# Patient Record
Sex: Female | Born: 1980
Health system: Southern US, Community
[De-identification: ages and names within clinical notes are randomized; demographics above are authoritative.]

## PROBLEM LIST (undated history)

## (undated) DIAGNOSIS — IMO0002 Reserved for concepts with insufficient information to code with codable children: Secondary | ICD-10-CM

## (undated) DIAGNOSIS — R87629 Unspecified abnormal cytological findings in specimens from vagina: Secondary | ICD-10-CM

## (undated) DIAGNOSIS — G629 Polyneuropathy, unspecified: Secondary | ICD-10-CM

## (undated) DIAGNOSIS — E049 Nontoxic goiter, unspecified: Secondary | ICD-10-CM

## (undated) DIAGNOSIS — E559 Vitamin D deficiency, unspecified: Secondary | ICD-10-CM

## (undated) DIAGNOSIS — R87619 Unspecified abnormal cytological findings in specimens from cervix uteri: Secondary | ICD-10-CM

## (undated) DIAGNOSIS — A599 Trichomoniasis, unspecified: Secondary | ICD-10-CM

## (undated) DIAGNOSIS — Z8619 Personal history of other infectious and parasitic diseases: Secondary | ICD-10-CM

## (undated) DIAGNOSIS — A749 Chlamydial infection, unspecified: Secondary | ICD-10-CM

## (undated) DIAGNOSIS — K5790 Diverticulosis of intestine, part unspecified, without perforation or abscess without bleeding: Secondary | ICD-10-CM

## (undated) DIAGNOSIS — G43909 Migraine, unspecified, not intractable, without status migrainosus: Secondary | ICD-10-CM

## (undated) DIAGNOSIS — Z8669 Personal history of other diseases of the nervous system and sense organs: Secondary | ICD-10-CM

## (undated) DIAGNOSIS — K5792 Diverticulitis of intestine, part unspecified, without perforation or abscess without bleeding: Secondary | ICD-10-CM

## (undated) HISTORY — DX: Polyneuropathy, unspecified: G62.9

## (undated) HISTORY — DX: Chlamydial infection, unspecified: A74.9

## (undated) HISTORY — PX: DENTAL SURGERY: SHX609

## (undated) HISTORY — PX: CYST EXCISION: SHX5701

## (undated) HISTORY — DX: Unspecified abnormal cytological findings in specimens from cervix uteri: R87.619

## (undated) HISTORY — PX: ENDOMETRIAL ABLATION: SHX621

## (undated) HISTORY — DX: Nontoxic goiter, unspecified: E04.9

## (undated) HISTORY — DX: Diverticulitis of intestine, part unspecified, without perforation or abscess without bleeding: K57.92

## (undated) HISTORY — DX: Personal history of other infectious and parasitic diseases: Z86.19

## (undated) HISTORY — DX: Trichomoniasis, unspecified: A59.9

## (undated) HISTORY — PX: CHOLECYSTECTOMY: SHX55

## (undated) HISTORY — DX: Personal history of other diseases of the nervous system and sense organs: Z86.69

## (undated) HISTORY — DX: Diverticulosis of intestine, part unspecified, without perforation or abscess without bleeding: K57.90

## (undated) HISTORY — DX: Vitamin D deficiency, unspecified: E55.9

## (undated) HISTORY — DX: Migraine, unspecified, not intractable, without status migrainosus: G43.909

## (undated) HISTORY — DX: Reserved for concepts with insufficient information to code with codable children: IMO0002

## (undated) HISTORY — DX: Unspecified abnormal cytological findings in specimens from vagina: R87.629

---
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
## 2001-04-26 ENCOUNTER — Encounter: Payer: Self-pay | Admitting: Obstetrics and Gynecology

## 2001-04-26 ENCOUNTER — Ambulatory Visit (HOSPITAL_COMMUNITY): Admission: RE | Admit: 2001-04-26 | Discharge: 2001-04-26 | Payer: Self-pay | Admitting: Obstetrics and Gynecology

## 2001-04-27 ENCOUNTER — Inpatient Hospital Stay (HOSPITAL_COMMUNITY): Admission: AD | Admit: 2001-04-27 | Discharge: 2001-04-30 | Payer: Self-pay | Admitting: Obstetrics and Gynecology

## 2002-08-05 ENCOUNTER — Emergency Department (HOSPITAL_COMMUNITY): Admission: EM | Admit: 2002-08-05 | Discharge: 2002-08-05 | Payer: Self-pay | Admitting: Emergency Medicine

## 2004-08-07 ENCOUNTER — Ambulatory Visit (HOSPITAL_COMMUNITY): Admission: AD | Admit: 2004-08-07 | Discharge: 2004-08-08 | Payer: Self-pay | Admitting: Obstetrics and Gynecology

## 2004-09-28 ENCOUNTER — Ambulatory Visit (HOSPITAL_COMMUNITY): Admission: RE | Admit: 2004-09-28 | Discharge: 2004-09-28 | Payer: Self-pay | Admitting: Internal Medicine

## 2004-10-18 ENCOUNTER — Ambulatory Visit (HOSPITAL_COMMUNITY): Admission: AD | Admit: 2004-10-18 | Discharge: 2004-10-18 | Payer: Self-pay | Admitting: Obstetrics and Gynecology

## 2004-11-13 ENCOUNTER — Inpatient Hospital Stay (HOSPITAL_COMMUNITY): Admission: RE | Admit: 2004-11-13 | Discharge: 2004-11-15 | Payer: Self-pay | Admitting: Internal Medicine

## 2007-03-03 ENCOUNTER — Other Ambulatory Visit: Admission: RE | Admit: 2007-03-03 | Discharge: 2007-03-03 | Payer: Self-pay | Admitting: Obstetrics and Gynecology

## 2008-04-03 ENCOUNTER — Other Ambulatory Visit: Admission: RE | Admit: 2008-04-03 | Discharge: 2008-04-03 | Payer: Self-pay | Admitting: Obstetrics and Gynecology

## 2008-04-04 ENCOUNTER — Ambulatory Visit (HOSPITAL_COMMUNITY): Admission: RE | Admit: 2008-04-04 | Discharge: 2008-04-04 | Payer: Self-pay | Admitting: Obstetrics & Gynecology

## 2008-11-08 ENCOUNTER — Encounter: Payer: Self-pay | Admitting: Obstetrics & Gynecology

## 2008-11-08 ENCOUNTER — Ambulatory Visit (HOSPITAL_COMMUNITY): Admission: RE | Admit: 2008-11-08 | Discharge: 2008-11-08 | Payer: Self-pay | Admitting: Obstetrics & Gynecology

## 2009-07-10 ENCOUNTER — Other Ambulatory Visit: Admission: RE | Admit: 2009-07-10 | Discharge: 2009-07-10 | Payer: Self-pay | Admitting: Obstetrics and Gynecology

## 2010-07-14 ENCOUNTER — Encounter: Payer: Self-pay | Admitting: Obstetrics & Gynecology

## 2010-08-05 ENCOUNTER — Other Ambulatory Visit (HOSPITAL_COMMUNITY)
Admission: RE | Admit: 2010-08-05 | Discharge: 2010-08-05 | Disposition: A | Discharge status: Home / Self Care | Payer: Self-pay | Source: Ambulatory Visit | Attending: Obstetrics & Gynecology | Admitting: Obstetrics & Gynecology

## 2010-08-05 ENCOUNTER — Other Ambulatory Visit: Payer: Self-pay | Admitting: Obstetrics & Gynecology

## 2010-08-05 DIAGNOSIS — Z01419 Encounter for gynecological examination (general) (routine) without abnormal findings: Secondary | ICD-10-CM | POA: Insufficient documentation

## 2010-10-01 LAB — CBC
HCT: 37.3 % (ref 36.0–46.0)
Hemoglobin: 13.2 g/dL (ref 12.0–15.0)
MCHC: 35.5 g/dL (ref 30.0–36.0)
MCV: 87 fL (ref 78.0–100.0)
Platelets: 156 10*3/uL (ref 150–400)
RBC: 4.28 MIL/uL (ref 3.87–5.11)
RDW: 13.4 % (ref 11.5–15.5)
WBC: 8.1 10*3/uL (ref 4.0–10.5)

## 2010-10-01 LAB — COMPREHENSIVE METABOLIC PANEL
ALT: 28 U/L (ref 0–35)
AST: 28 U/L (ref 0–37)
Albumin: 3.7 g/dL (ref 3.5–5.2)
Alkaline Phosphatase: 70 U/L (ref 39–117)
BUN: 4 mg/dL — ABNORMAL LOW (ref 6–23)
CO2: 25 mEq/L (ref 19–32)
Calcium: 9.1 mg/dL (ref 8.4–10.5)
Chloride: 108 mEq/L (ref 96–112)
Creatinine, Ser: 0.65 mg/dL (ref 0.4–1.2)
GFR calc Af Amer: >60 mL/min (ref >60)
GFR calc non Af Amer: >60 mL/min (ref >60)
Glucose, Bld: 90 mg/dL (ref 70–99)
Potassium: 4.4 mEq/L (ref 3.5–5.1)
Sodium: 139 mEq/L (ref 135–145)
Total Bilirubin: 0.8 mg/dL (ref 0.3–1.2)
Total Protein: 6.4 g/dL (ref 6.0–8.3)

## 2010-10-01 LAB — URINALYSIS, MICROSCOPIC ONLY
Glucose, UA: NEGATIVE mg/dL
Hgb urine dipstick: NEGATIVE
Ketones, ur: NEGATIVE mg/dL
Protein, ur: NEGATIVE mg/dL
Urobilinogen, UA: 0.2 mg/dL (ref 0.0–1.0)

## 2010-11-05 NOTE — Op Note (Signed)
NAMEWILDER, AMODEI                 ACCOUNT NO.:  [id number]   MEDICAL RECORD NO.:  [id number]          PATIENT TYPE:  AMB   LOCATION:  DAY                           FACILITY:  [facility]   PHYSICIAN:  Lazaro Arms, M.D.   DATE OF BIRTH:  04-23-1981   DATE OF PROCEDURE:  11/08/2008  DATE OF DISCHARGE:  11/08/2008                               OPERATIVE REPORT   PREOPERATIVE DIAGNOSES:  1. Menometrorrhagia.  2. Dysmenorrhea.   POSTOPERATIVE DIAGNOSES:  1. Menometrorrhagia.  2. Dysmenorrhea.   PROCEDURE:  1. Hysteroscopy.  2. Dilatation and curettage.  3. Endometrial ablation.   SURGEON:  Lazaro Arms, MD   ANESTHESIA:  General endotracheal.   FINDINGS:  The patient had a normal endometrium, no polyps, no fluid, no  fibroids, no abnormalities.   DESCRIPTION OF OPERATION:  The patient was taken to the operating room,  placed in supine position, and underwent general endotracheal  anesthesia, placed in dorsal lithotomy position, prepped and draped in  usual sterile fashion.  The cervix was dilated serially to allow passage  of the hysteroscope.  Hysteroscopy was performed and found to be normal.  Uterine curettage was performed.  ThermaChoice III endometrial ablation  balloon was then used, 23 mL of D5W was required to maintain a pressure  between 109 mmHg throughout the procedure.  It was heated to 88 degrees  Celsius.  Total therapy time was 10 minutes and 5 seconds.  All the  fluid was returned at the end of the procedure.  The patient tolerated  the procedure well.  She experienced minimal blood loss, was taken to  recovery in good stable condition.  All counts were correct x3.      Lazaro Arms, M.D.  Electronically Signed     LHE/MEDQ  D:  11/08/2008  T:  11/09/2008  Job:  [id number]

## 2010-11-08 NOTE — H&P (Signed)
[facility]  Patient:    Nina Terry, Nina Terry Visit Number: [id number] MRN: [id number]          Service Type: OBS Location: 4A A415 01 Attending Physician:  Tilda Burrow Dictated by:   Zerita Boers, N.M. Admit Date:  04/26/2001                           History and Physical  ADMITTING DIAGNOSIS:  Pregnancy at 40 weeks with macrosomia who presented with pregnancy induced hypertension.  HISTORY OF PRESENT ILLNESS:  Nina Terry was admitted late in the evening last night with elevated blood pressure, 1+ pitting edema, bleeding and cramping since she left the office today.  PAST MEDICAL HISTORY:  Negative.  PAST SURGICAL HISTORY:  Oral surgery.  MEDICATION:  Prenatal vitamins.  SOCIAL HISTORY:  She is single.  Her boyfriend is presently supportive.  PRENATAL CARE:  Blood type is O positive.  UDS is positive for THC early in the pregnancy.  Rubella immune.  Hepatitis B surface antigen negative.  HIV nonreactive.  HSV negative.  HPV negative.  Serology nonreactive.  GC and chlamydia are both negative.  MSAFP was elevated and she had a rescan which was normal.  Hemoglobin 10.8 and hematocrit 35.1.  One hour glucose tolerance was 98.  Sickle cell screen negative.  PHYSICAL EXAMINATION:  VITAL SIGNS:  Blood pressure 157/99.  NEUROLOGIC:  DTRs 1+ bilaterally.  EXTREMITIES:  She has 1+ pitting edema.  She has facial edema.  LABORATORY DATA AND X-RAY FINDINGS:  Urine negative for protein.  Estimated fetal weight is 9+ pounds.  PLAN:  Due to narrow pelvic inlet and a nonfavorable cervix and a fetus that is not in the pelvis, it has been discussed with the patient risks and benefits of vaginal versus cesarean birth discussed.  The decision was made to do a primary cesarean section due to fetal macrosomia. Dictated by:   Zerita Boers, N.M. Attending Physician:  Tilda Burrow DD:  04/27/01 TD:  04/27/01 Job: [id number]

## 2010-11-08 NOTE — Op Note (Signed)
[facility]  Patient:    Nina Terry, Nina Terry Visit Number: [id number] MRN: [id number]          Service Type: OUT Location: RAD Attending Physician:  Tilda Burrow Dictated by:   Christin Bach, M.D. Proc. Date: 04/27/01 Admit Date:  04/26/2001 Discharge Date: 04/26/2001   CC:         Mel Almond, M.D.   Operative Report  PREOPERATIVE DIAGNOSES: 1. Pregnancy at 38-1/[redacted] weeks gestation. 2. Fetal macrosomia. 3. 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 4. Early pregnancy-induced hypertension symptoms.  POSTOPERATIVE DIAGNOSES: 1. Pregnancy at 38-1/[redacted] weeks gestation. 2. Fetal macrosomia. 3. 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 4. Early pregnancy-induced hypertension symptoms.  OPERATION:  Primary low transverse cervical cesarean section.  SURGEON:  Christin Bach, M.D.  ASSISTANT:  Zerita Boers, M.D.  ANESTHESIA:  Spinal.  COMPLICATIONS:  Slightly elevated spinal anesthesia to level of C6, without sequelae.  DESCRIPTION OF PROCEDURE:  The patient was taken to the operating room and prepped and draped for low abdominal surgery.  Pfannenstiel type incision was performed without difficulty.  The bladder flap was very high on the anterior abdominal wall, so the peritoneal incision was transversely made just above the bladder.  The patient then had transverse uterine incision performed after bladder flap mobilization on the front of the uterus, and a transverse incision was made without difficulty and extended laterally using index finger traction.  The vacuum extractor was placed on the fetal vertex.  A generous loop of cord prolapsed through the incision.  Vacuum extractor popped off once before successful delivery of the vertex using fundal pressure as the propulsive force with vacuum extractor guidance.  The patient then had the infant delivered to awaiting Dr. Lilyan Punt for management.  The infant cried vigorously from the start and had Apgars 8/10 assigned by  Dr. Gerda Diss.  Placenta was delivered with cord blood sampled obtained and are documented elsewhere.  The placenta delivered was noted to have a marginal insertion of the cord, approximately 4 cm from the edge of the placenta and only a very short distance from the opening in the membranes that represented the uterine incision opening.  We interpreted this as an anterior, low-lying placenta location.  The patient then had irrigation of the uterus and single layer running locking closure of the uterus.  A single interrupted 2-0 chromic was necessary to complete adequate hemostasis.  Then irrigation of the bladder flap, closure of the bladder flap with 2-0 chromic; then irrigation of the abdomen, 2-0 chronic closure of the peritoneal cavity, 0 Vicryl closure of the fascia, 2-0 plain approximation of the subcutaneous fatty tissue with staple closure of the skin.  The patient tolerated the procedure well and went to the recovery room in good condition. Dictated by:   Christin Bach, M.D. Attending Physician:  Tilda Burrow DD:  04/27/01 TD:  04/28/01 Job: [id number]

## 2010-11-08 NOTE — Discharge Summary (Signed)
Nina Terry, Nina Terry                 ACCOUNT NO.:  [id number]   MEDICAL RECORD NO.:  [id number]          PATIENT TYPE:  INP   LOCATION:  A412                          FACILITY:  [facility]   PHYSICIAN:  Tilda Burrow, M.D. DATE OF BIRTH:  01/05/1981   DATE OF ADMISSION:  11/13/2004  DATE OF DISCHARGE:  05/26/2006LH                                 DISCHARGE SUMMARY   ADMISSION DIAGNOSES:  1.  Pregnancy at 38-1/2 weeks' gestation with repeat cesarean section, not      for trial of labor and desires elective permanent sterilization.  2.  Anemia of pregnancy.   DISCHARGE DIAGNOSES:  1.  Pregnancy at 38-1/2 weeks' gestation with repeat cesarean section, not      for trial of labor and desires elective permanent sterilization.  2.  Postoperative anemia.   PROCEDURES:  1.  Repeat low transverse cervical cesarean section.  2.  Bilateral tubal ligation.   DISCHARGE MEDICATIONS:  Tylox one q.4h. p.r.n. pain, dispense #30.   HISTORY OF PRESENT ILLNESS:  This 30 year old female, G2, P1, AB0 was  admitted at term gestation for repeat cesarean section and tubal ligation.  She is desirous of sterilization despite her young age and had been  counseled multiple times during the pregnancy regarding risks and potential  future issues based on her young age.  The patient is completely confident  of her desire for no more children regardless of life changes.  Physical  exam shows a moderately obese, Caucasian female with 41 cm fundal height  with prenatal labs in the admitting history.   HOSPITAL COURSE:  The patient was admitted and underwent repeat low  transverse cervical cesarean section and bilateral tubal ligation on Nov 13, 2004.  She had an admitting hemoglobin of 11.9, hematocrit 34.5.  Blood loss  at the time of surgery was estimated at 500 cc.  Postoperatively, the  patient had anemia.  She had a hemoglobin of 9.5, hematocrit 27.5.  She was  sent home on Chromagen Forte one twice daily x30  days along with routine  postoperative medications for followup in 5 days for staple removal.  Routine postop instructions given.       JVF/MEDQ  D:  01/10/2005  T:  01/10/2005  Job:  [id number]

## 2010-11-08 NOTE — H&P (Signed)
Nina Terry, Nina Terry                 ACCOUNT NO.:  [id number]   MEDICAL RECORD NO.:  [id number]          PATIENT TYPE:  AMB   LOCATION:  DAY                           FACILITY:  [facility]   PHYSICIAN:  Tilda Burrow, M.D. DATE OF BIRTH:  1981-03-04   DATE OF ADMISSION:  11/13/2004  DATE OF DISCHARGE:  LH                                HISTORY & PHYSICAL   ADMISSION DIAGNOSES:  1.  Pregnancy at 38-1/[redacted] weeks gestation.  2.  Will repeat cesarean section and offer a trial of labor.  3.  Desire for elective permanent sterilization.   HISTORY OF PRESENT ILLNESS:  This 30 year old female, gravida 2, para 1, AB  0, LMP unknown with ultrasound assigned EDC of November 23, 2004, based on first  trimester scan and Nov 14, 2004, based on a second trimester scan at 20  weeks.  She is admitted after pregnancy course followed through our office  through 13 prenatal visits with generous fundal height growth, size finally  greater than dates, with plans to proceed with repeat cesarean section at  this time.  She also desires permanent sterilization, acknowledging the  permanency of the procedure even though she is only 23.  Failure rate is  considered 1 to 2 per 100 for cesarean section tubal sterilizations.   PAST MEDICAL HISTORY:  Benign.   PAST SURGICAL HISTORY:  Cesarean section in 2002.   ALLERGIES:  None known.   PHYSICAL EXAMINATION:  GENERAL:  Healthy-appearing Caucasian female, alert  and oriented x 3.  HEENT:  Pupils equal, round, and reactive.  Extraocular movements intact.  NECK:  Supple.  ABDOMEN:  Moderate obesity.  Fundal height 41 cm.  PELVIC:  Cervix closed at prior exam.   PRENATAL LABORATORY DATA:  Blood type O positive.  Urine drug screen  positive for THC initially and retested as negative in third trimester.  Hemoglobin 12, hematocrit 40.  Hepatitis, HIV, RPR, GC, and chlamydia all  negative.  She is HSV-2 negative.  Glucose tolerance test is 107 mg percent.   PLAN:  Repeat  cesarean section and tubal ligation on Nov 13, 2004.      JVF/MEDQ  D:  11/12/2004  T:  11/12/2004  Job:  [id number]   cc:   Francoise Schaumann. Halford Chessman  Fax: [phone]

## 2010-11-08 NOTE — Discharge Summary (Signed)
Nina Terry, Nina Terry                 ACCOUNT NO.:  [id number]   MEDICAL RECORD NO.:  [id number]          PATIENT TYPE:  OIB   LOCATION:  A414                          FACILITY:  [facility]   PHYSICIAN:  Langley Gauss, MD     DATE OF BIRTH:  December 28, 1980   DATE OF ADMISSION:  10/18/2004  DATE OF DISCHARGE:  04/28/2006LH                                 DISCHARGE SUMMARY   HISTORY OF PRESENT ILLNESS:  A 30 year old gravida 2, para 1, [redacted] weeks  gestation with one prior low transverse cesarean section complains of  greenish discharge with odor x2 days duration.  Actually, felt moist in her  undergarments today, denies any leakage of fluids, specifically, no clear  fluid, no fluid running down her leg.  The patient's prenatal course has  been complicated only by findings of positive marijuana on drug screen.  The  patient is planning on proceeding with permanent sterilization.  She  describes good fetal movement, denies any vaginal bleeding.  She states she  has been having contractions for several days duration.   PHYSICAL EXAMINATION:  GENERAL:  Obese female in no acute distress.  External fetal monitor:  Fetal heart rate 150, reassuring fetal heart rate.  Uterine activity noted every two to four minutes.  The patient states this  is no change over the past several days.  PELVIC:  Normal external genitalia.  No lesions or ulcerations identified.  Sterile speculum examination is performed which reveals a greenish odorous  discharge coming from the cervix.   GC and Chlamydia cultures are performed, currently pending.  Wet prep is  deferred secondary to the logistic difficulties of the staff here in  providing glass slide, sterile normal slide, and a cover slip all on the  same occasion.  The patient thus has been treated empirically with Flagyl  500 mg p.o. b.i.d. x7 days.  Urinalysis was performed which did reveal many  epithelial cells and positive esterase.  Urine culture is currently pending.  Signs and symptoms of labor as well as spontaneous rupture of membranes  reviewed with the patient.  She is to be discharged to home with the  prescription for Flagyl 500 mg p.o. b.i.d. x7 days.  Cervix is noted to be  closed.   LABORATORY DATA:  Urinalysis: Urine specific gravity is greater than 1.03,  moderate hemoglobin, moderate leukocyte esterase, with culture currently  pending.      DC/MEDQ  D:  10/18/2004  T:  10/19/2004  Job:  [id number]

## 2010-11-08 NOTE — Consult Note (Signed)
NAMEGLAYDS, [employer]SC[employer]                 ACCOUNT NO.:  [id number]   MEDICAL RECORD NO.:  [id number]          PATIENT TYPE:  OIB   LOCATION:  LDR3                          FACILITY:  [facility]   PHYSICIAN:  Tilda Burrow, M.D. DATE OF BIRTH:  1981/04/18   DATE OF CONSULTATION:  DATE OF DISCHARGE:  09/28/2004                                   CONSULTATION   CONSULTING PHYSICIAN:  Tilda Burrow, M.D.   CHIEF COMPLAINT:  1.  Pregnancy 32 weeks' gestation prior cesarean section for repeat C-      section and tubal ligation.  2.  Suspected urinary tract infection.   HISTORY OF PRESENT ILLNESS:  A 30 year old female, gravida 2, para 1,  uncertain LMP with ultrasound EDC of November 23, 2004, on six week ultrasound.  Followed in our office with one prior urinary tract infection, presents to  labor and delivery complaining of urinary tract symptoms.  Fetal monitoring  shows no uterine irritability.  Urinalysis shows large amounts of leukocyte  esterase, 7-10 white cells, few bacteria with negative nitrites.  Based on a  very classic history of urinary frequency, etcetera, we will treat for  urinary tract infection.   PLAN:  1.  Rx Macrobid 100 mg b.i.d. x 7 days.  2.  Followup appointment as scheduled for our office.  3.  After completion of this regimen, we will suppress with Macrodantin 100      mg q.h.s.  Rx to be written at next office visit.      JVF/MEDQ  D:  09/28/2004  T:  09/29/2004  Job:  [id number]

## 2010-11-08 NOTE — Op Note (Signed)
Nina Terry, Nina Terry                 ACCOUNT NO.:  [id number]   MEDICAL RECORD NO.:  [id number]          PATIENT TYPE:  INP   LOCATION:  A412                          FACILITY:  [facility]   PHYSICIAN:  Tilda Burrow, M.D. DATE OF BIRTH:  June 03, 1981   DATE OF PROCEDURE:  11/13/2004  DATE OF DISCHARGE:  11/15/2004                                 OPERATIVE REPORT   PREOPERATIVE DIAGNOSES:  1.  Pregnancy, 38-1/[redacted] weeks gestation.  2.  Repeat cesarean section, not for trial of labor.  3.  Elective permanent sterilization.   POSTOPERATIVE DIAGNOSES:  1.  Pregnancy, 38-1/[redacted] weeks gestation.  2.  Repeat cesarean section, not for trial of labor.  3.  Elective permanent sterilization.   PROCEDURE:  Repeat low transverse cervical cesarean section, bilateral  partial salpingectomy.   SURGEON:  Tilda Burrow, M.D.   ASSISTANT:  None.   ANESTHESIA:  Spinal.   COMPLICATIONS:  None.   FINDINGS:  A healthy infant.  Apgars 9 and 9.   INDICATIONS:  A 31 year old female, gravida 2, para 1.  Not desiring trial  of labor and requesting permanent sterilization.   DESCRIPTION OF PROCEDURE:  Patient was taken to the operating room.  Prepped  and draped in the usual fashion for lower abdominal surgery.  A Pfannenstiel-  type incision was repeated with easy entry of the abdominal cavity.  The  bladder flap was developed on the lower uterine segment.  A transverse  uterine incision made in the lower uterine segment, extended laterally using  index finger traction and then the baby delivered by fundal pressure,  combined with vacuum extraction guidance to the vertex.  The cord was  clamped.  The infant was placed in the care of the waiting pediatrician.  Then the cord blood gases were obtained, and the placenta was delivered via  Glasgow presentation.  The uterus was irrigated with antibiotic solution.  A  single layer of running, locking closure of the uterus performed followed by  bladder flap  reapproximation.   Tubal ligation:  The tubal ligation was then performed by identifying the  fallopian tube on the right side first and then the left side, incarcerating  the mid segment knuckle of tube with a double ligature of 2-0 chromic and  excising the incarcerated knuckle of tissue.  The patient tolerated the  procedure well and was then hemostatic.  The abdomen was irrigated briefly,  as had been the uterus.  Then the anterior peritoneum was closed with 2-0  chromic.  The fascia was closed with continuous running 0 Vicryl.  The subcu  tissues were reapproximated using 2-0 plain and staple closure of the skin  was used to complete the procedure.  The patient went to the recovery room  in good condition.  Sponge and needle counts were correct.      JVF/MEDQ  D:  11/25/2004  T:  11/25/2004  Job:  [id number]   cc:   [facility]

## 2011-06-13 ENCOUNTER — Encounter: Payer: Self-pay | Admitting: Obstetrics and Gynecology

## 2012-10-18 ENCOUNTER — Ambulatory Visit (INDEPENDENT_AMBULATORY_CARE_PROVIDER_SITE_OTHER): Payer: BC Managed Care – PPO | Admitting: Adult Health

## 2012-10-18 ENCOUNTER — Encounter: Payer: Self-pay | Admitting: Adult Health

## 2012-10-18 VITALS — BP 112/72 | Ht 63.0 in | Wt 237.0 lb

## 2012-10-18 DIAGNOSIS — A599 Trichomoniasis, unspecified: Secondary | ICD-10-CM

## 2012-10-18 DIAGNOSIS — N898 Other specified noninflammatory disorders of vagina: Secondary | ICD-10-CM

## 2012-10-18 DIAGNOSIS — Z8742 Personal history of other diseases of the female genital tract: Secondary | ICD-10-CM

## 2012-10-18 DIAGNOSIS — Z87898 Personal history of other specified conditions: Secondary | ICD-10-CM | POA: Insufficient documentation

## 2012-10-18 HISTORY — DX: Trichomoniasis, unspecified: A59.9

## 2012-10-18 LAB — POCT URINALYSIS DIPSTICK
Bilirubin, UA: NEGATIVE
Glucose, UA: NEGATIVE
Spec Grav, UA: 1.02
pH, UA: 7

## 2012-10-18 LAB — POCT WET PREP (WET MOUNT): WBC, Wet Prep HPF POC: POSITIVE

## 2012-10-18 MED ORDER — METRONIDAZOLE 500 MG PO TABS: ORAL_TABLET | ORAL | Status: DC

## 2012-10-18 NOTE — Progress Notes (Signed)
Subjective:     Patient ID: Nina Terry, female   DOB: [birth year], 32 y.o.   MRN: [id number]  HPI Nina Terry is 32 year old white female in today complaining of vaginal discharge and odor,with itching.She has had unprotected sex, and it hurt last time.Her partner was her kids daddy but they had split up. She needs a pap and physical in near future. Review of Systems Patient denies any headaches, blurred vision, shortness of breath, chest pain, abdominal pain, problems with bowel movements. She does say she feels uncomfortable with urination, sex hurt last time and she has vaginal discharge and itch and odor.   Reviewed past medical,surgical, social and family history. Reviewed medications and allergies.  Objective:   Physical Exam Blood pressure 112/72, height 5\' 3"  (1.6 m), weight 237 lb (107.502 kg).   Urine had trace blood and large WBC. Skin warm and dry.Pelvic: external genitalia is normal in appearance, vagina: grayish discharge with odor and tender side walls, cervix:smooth and bulbous,negative CMT, uterus: normal size, shape and contour, non tender, no masses felt, adnexa: no masses or tenderness noted. Wet prep: + for trichomonas and +WBCs. GC/CHL obtained.  Assessment:      Trichomonas  Vaginal discharge History of abnormal pap    Plan:      Take flagyl, no sex, no alcohol Gave her Rx for flagyl 500 mg # 4, take 4 po now for partner, Nina Terry Return in 2 weeks for proof of treatment, will schedule pap after that. Call in am for GC/CHL results

## 2012-10-18 NOTE — Patient Instructions (Addendum)
Trichomoniasis Trichomoniasis is an infection, caused by the Trichomonas organism, that affects both women and men. In women, the outer female genitalia and the vagina are affected. In men, the penis is mainly affected, but the prostate and other reproductive organs can also be involved. Trichomoniasis is a sexually transmitted disease (STD) and is most often passed to another person through sexual contact. The majority of people who get trichomoniasis do so from a sexual encounter and are also at risk for other STDs. CAUSES   Sexual intercourse with an infected partner.  It can be present in swimming pools or hot tubs. SYMPTOMS   Abnormal gray-green frothy vaginal discharge in women.  Vaginal itching and irritation in women.  Itching and irritation of the area outside the vagina in women.  Penile discharge with or without pain in males.  Inflammation of the urethra (urethritis), causing painful urination.  Bleeding after sexual intercourse. RELATED COMPLICATIONS  Pelvic inflammatory disease.  Infection of the uterus (endometritis).  Infertility.  Tubal (ectopic) pregnancy.  It can be associated with other STDs, including gonorrhea and chlamydia, hepatitis B, and HIV. COMPLICATIONS DURING PREGNANCY  Early (premature) delivery.  Premature rupture of the membranes (PROM).  Low birth weight. DIAGNOSIS   Visualization of Trichomonas under the microscope from the vagina discharge.  Ph of the vagina greater than 4.5, tested with a test tape.  Trich Rapid Test.  Culture of the organism, but this is not usually needed.  It may be found on a Pap test.  Having a "strawberry cervix,"which means the cervix looks very red like a strawberry. TREATMENT   You may be given medication to fight the infection. Inform your caregiver if you could be or are pregnant. Some medications used to treat the infection should not be taken during pregnancy.  Over-the-counter medications or  creams to decrease itching or irritation may be recommended.  Your sexual partner will need to be treated if infected. HOME CARE INSTRUCTIONS   Take all medication prescribed by your caregiver.  Take over-the-counter medication for itching or irritation as directed by your caregiver.  Do not have sexual intercourse while you have the infection.  Do not douche or wear tampons.  Discuss your infection with your partner, as your partner may have acquired the infection from you. Or, your partner may have been the person who transmitted the infection to you.  Have your sex partner examined and treated if necessary.  Practice safe, informed, and protected sex.  See your caregiver for other STD testing. SEEK MEDICAL CARE IF:   You still have symptoms after you finish the medication.  You have an oral temperature above 102 F (38.9 C).  You develop belly (abdominal) pain.  You have pain when you urinate.  You have bleeding after sexual intercourse.  You develop a rash.  The medication makes you sick or makes you throw up (vomit). Document Released: 12/03/2000 Document Revised: 09/01/2011 Document Reviewed: 12/29/2008 [facility] Patient Information 2013 [place], [place]. TrichomonasTest This is a test used to diagnose an infection with Trichomonas vaginalis. This is a sexually transmitted, microscopic parasite that causes vaginal infections in women and urethritis in some men. This test is often done if there is vaginal discharge or pain on urination. If you have an infection with another sexually transmitted disease, your caregiver might test for trichomonas as well. Secretions or a sample are collected on a swab and are examined under a microscope or cultured to detect the presence of Trichomonas vaginalis. Trichomonas is one of  the most common sexually transmitted diseases. An infected person is at greater risk of getting other sexually transmitted diseases, so your caregiver may want  to test for these other infections also.  Trichomonas infection can affect pregnancy, contributing to premature birth and low birth weight. You should inform your physician if you may be pregnant. The doctor may medically manage a woman who is infected and in her first three months of pregnancy differently.  SPECIMEN COLLECTION In women, a swab of secretions is collected from the vagina. In men, a swab is inserted into the urethra. MEANING OF TEST  A positive test indicates an active infection that requires treatment with antibiotics. It is usually treated with an antibiotic called metronidazole. All current sexual partners must be treated at the same time or the patient is likely to become re-infected. The Center for Disease Control (CDC) recommends the following guidelines to avoid infection or reinfection:   Abstain from sexual intercourse  Use a latex condom properly, every time you have sexual intercourse, with every partner.  Limit your sexual partners. The more sex partners you have, the greater your risk of encountering someone who has this or other STI's.  If you are infected, your sexual partner(s) should be treated. This will prevent you from getting reinfected. OBTAINING THE TEST RESULTS It is your responsibility to obtain your test results. Ask the lab or department performing the test when and how you will get your results. Document Released: 07/12/2004 Document Revised: 09/01/2011 Document Reviewed: 03/19/2005 [facility] Patient Information 2013 [place], [place]. Take flagyl, no sex,no alcohol, Call in am for results,Sign up for my chart.Return in 2 weeks proof of treatment. Then schedule pap and physical

## 2012-10-18 NOTE — Assessment & Plan Note (Signed)
Will treat pt and partner with flagyl

## 2012-10-19 ENCOUNTER — Telehealth: Payer: Self-pay | Admitting: Adult Health

## 2012-10-19 LAB — GC/CHLAMYDIA PROBE AMP: CT Probe RNA: NEGATIVE

## 2012-10-19 NOTE — Telephone Encounter (Signed)
Pt aware GC/CHL negative 

## 2012-11-01 ENCOUNTER — Encounter: Payer: Self-pay | Admitting: *Deleted

## 2012-11-02 ENCOUNTER — Ambulatory Visit: Payer: Self-pay | Admitting: Adult Health

## 2013-08-11 ENCOUNTER — Other Ambulatory Visit (HOSPITAL_COMMUNITY)
Admission: RE | Admit: 2013-08-11 | Discharge: 2013-08-11 | Disposition: A | Discharge status: Home / Self Care | Payer: BC Managed Care – PPO | Source: Ambulatory Visit | Attending: Adult Health | Admitting: Adult Health

## 2013-08-11 ENCOUNTER — Encounter (INDEPENDENT_AMBULATORY_CARE_PROVIDER_SITE_OTHER): Payer: Self-pay

## 2013-08-11 ENCOUNTER — Ambulatory Visit (INDEPENDENT_AMBULATORY_CARE_PROVIDER_SITE_OTHER): Payer: BC Managed Care – PPO | Admitting: Adult Health

## 2013-08-11 ENCOUNTER — Encounter: Payer: Self-pay | Admitting: Adult Health

## 2013-08-11 VITALS — BP 120/82 | HR 78 | Ht 63.0 in | Wt 223.0 lb

## 2013-08-11 DIAGNOSIS — Z8742 Personal history of other diseases of the female genital tract: Secondary | ICD-10-CM

## 2013-08-11 DIAGNOSIS — R87810 Cervical high risk human papillomavirus (HPV) DNA test positive: Secondary | ICD-10-CM | POA: Insufficient documentation

## 2013-08-11 DIAGNOSIS — Z124 Encounter for screening for malignant neoplasm of cervix: Secondary | ICD-10-CM | POA: Insufficient documentation

## 2013-08-11 DIAGNOSIS — Z01419 Encounter for gynecological examination (general) (routine) without abnormal findings: Secondary | ICD-10-CM

## 2013-08-11 DIAGNOSIS — Z1151 Encounter for screening for human papillomavirus (HPV): Secondary | ICD-10-CM | POA: Insufficient documentation

## 2013-08-11 LAB — COMPREHENSIVE METABOLIC PANEL
ALBUMIN: 4.1 g/dL (ref 3.5–5.2)
ALK PHOS: 59 U/L (ref 39–117)
ALT: 28 U/L (ref 0–35)
AST: 28 U/L (ref 0–37)
BUN: 6 mg/dL (ref 6–23)
CALCIUM: 8.7 mg/dL (ref 8.4–10.5)
CHLORIDE: 105 meq/L (ref 96–112)
CO2: 27 mEq/L (ref 19–32)
CREATININE: 0.63 mg/dL (ref 0.50–1.10)
Glucose, Bld: 78 mg/dL (ref 70–99)
POTASSIUM: 4.2 meq/L (ref 3.5–5.3)
Sodium: 137 mEq/L (ref 135–145)
Total Bilirubin: 0.4 mg/dL (ref 0.2–1.2)
Total Protein: 6.4 g/dL (ref 6.0–8.3)

## 2013-08-11 LAB — LIPID PANEL
Cholesterol: 140 mg/dL (ref 0–200)
HDL: 35 mg/dL — AB (ref >39)
LDL CALC: 84 mg/dL (ref 0–99)
TRIGLYCERIDES: 107 mg/dL (ref <150)
Total CHOL/HDL Ratio: 4 Ratio
VLDL: 21 mg/dL (ref 0–40)

## 2013-08-11 LAB — CBC
HCT: 41.5 % (ref 36.0–46.0)
Hemoglobin: 13.6 g/dL (ref 12.0–15.0)
MCH: 30.2 pg (ref 26.0–34.0)
MCHC: 32.8 g/dL (ref 30.0–36.0)
MCV: 92 fL (ref 78.0–100.0)
PLATELETS: 176 10*3/uL (ref 150–400)
RBC: 4.51 MIL/uL (ref 3.87–5.11)
RDW: 13.4 % (ref 11.5–15.5)
WBC: 8.9 10*3/uL (ref 4.0–10.5)

## 2013-08-11 LAB — TSH: TSH: 1.288 u[IU]/mL (ref 0.350–4.500)

## 2013-08-11 NOTE — Progress Notes (Addendum)
Patient ID: Nina Terry, female   DOB: 01/01/1981, 33 y.o.   MRN: [id number] History of Present Illness: Nina Terry is a 33 year old white female in for a pap and physical,no complaints today.Got flu shot at work.   Current Medications, Allergies, Past Medical History, Past Surgical History, Family History and Social History were reviewed in [facility] electronic medical record.   Past Medical History  Diagnosis Date  . Abnormal Pap smear   . Chlamydia   . Enlarged thyroid   . Trichomonas 10/18/2012  . Enlarged thyroid   . Hx of chlamydia infection   . Vaginal Pap smear, abnormal    Past Surgical History  Procedure Laterality Date  . Cesarean section    . Endometrial ablation    . Cesarean section with bilateral tubal ligation  2006  . Cholecystectomy    No current outpatient prescriptions on file.  Review of Systems: Patient denies any headaches, blurred vision, shortness of breath, chest pain, abdominal pain, problems with bowel movements, urination, or intercourse. No joint pain or mood swings,    Physical Exam:BP 120/82  Pulse 78  Ht 5\' 3"  (1.6 m)  Wt 223 lb (101.152 kg)  BMI 39.51 kg/m2 General:  Well developed, well nourished, no acute distress Skin:  Warm and dry Neck:  Midline trachea, thyroid enlarged globally Lungs; Clear to auscultation bilaterally Breast:  No dominant palpable mass, retraction, or nipple discharge Cardiovascular: Regular rate and rhythm Abdomen:  Soft, non tender, no hepatosplenomegaly Pelvic:  External genitalia is normal in appearance.  The vagina is normal in appearance.  The cervix is bulbous and smooth, pap performed with HPV.  Uterus is felt to be normal size, shape, and contour.  No  adnexal masses or tenderness noted. Extremities:  No swelling or varicosities noted Psych:  No mood changes, alert and cooperative, seems happy   Impression: Yearly gyn exam History of abnormal pap   Plan: Physical in 1 year Mammogram at 41  Check  CBC,CMP,TSH and lipids

## 2013-08-11 NOTE — Patient Instructions (Addendum)
Physical in 1 year Mammogram at 41 Follow up labs next week

## 2013-08-12 ENCOUNTER — Telehealth: Payer: Self-pay | Admitting: Adult Health

## 2013-08-12 NOTE — Telephone Encounter (Signed)
Left message that labs normal, just increase activity

## 2013-08-19 ENCOUNTER — Telehealth: Payer: Self-pay | Admitting: Adult Health

## 2013-08-19 NOTE — Telephone Encounter (Signed)
Left message to call about pap 

## 2014-04-24 ENCOUNTER — Encounter: Payer: Self-pay | Admitting: Adult Health

## 2015-03-28 ENCOUNTER — Ambulatory Visit (INDEPENDENT_AMBULATORY_CARE_PROVIDER_SITE_OTHER): Payer: BLUE CROSS/BLUE SHIELD | Admitting: Women's Health

## 2015-03-28 ENCOUNTER — Other Ambulatory Visit (HOSPITAL_COMMUNITY)
Admission: RE | Admit: 2015-03-28 | Discharge: 2015-03-28 | Disposition: A | Discharge status: Home / Self Care | Payer: BLUE CROSS/BLUE SHIELD | Source: Ambulatory Visit | Attending: Obstetrics & Gynecology | Admitting: Obstetrics & Gynecology

## 2015-03-28 ENCOUNTER — Other Ambulatory Visit: Payer: Self-pay | Admitting: Women's Health

## 2015-03-28 ENCOUNTER — Encounter: Payer: Self-pay | Admitting: Women's Health

## 2015-03-28 VITALS — BP 106/64 | HR 72 | Ht 63.75 in | Wt 206.0 lb

## 2015-03-28 DIAGNOSIS — R232 Flushing: Secondary | ICD-10-CM | POA: Insufficient documentation

## 2015-03-28 DIAGNOSIS — Z1151 Encounter for screening for human papillomavirus (HPV): Secondary | ICD-10-CM | POA: Diagnosis present

## 2015-03-28 DIAGNOSIS — Z01411 Encounter for gynecological examination (general) (routine) with abnormal findings: Secondary | ICD-10-CM | POA: Diagnosis present

## 2015-03-28 DIAGNOSIS — Z01419 Encounter for gynecological examination (general) (routine) without abnormal findings: Secondary | ICD-10-CM | POA: Diagnosis not present

## 2015-03-28 DIAGNOSIS — N63 Unspecified lump in unspecified breast: Secondary | ICD-10-CM

## 2015-03-28 DIAGNOSIS — N6320 Unspecified lump in the left breast, unspecified quadrant: Secondary | ICD-10-CM | POA: Insufficient documentation

## 2015-03-28 DIAGNOSIS — Z113 Encounter for screening for infections with a predominantly sexual mode of transmission: Secondary | ICD-10-CM | POA: Insufficient documentation

## 2015-03-28 DIAGNOSIS — F17200 Nicotine dependence, unspecified, uncomplicated: Secondary | ICD-10-CM | POA: Insufficient documentation

## 2015-03-28 DIAGNOSIS — N898 Other specified noninflammatory disorders of vagina: Secondary | ICD-10-CM | POA: Insufficient documentation

## 2015-03-28 NOTE — Progress Notes (Signed)
Patient ID: Nina Terry, female   DOB: 1981/03/11, 34 y.o.   MRN: [id number] Subjective:   Nina Terry is a 34 y.o. G2P2 Caucasian female here for a routine well-woman exam.  No LMP recorded. Patient has had an ablation.    Current complaints: hot flashes, vaginal dryness- feels like she is going through menopause. Was adopted, so unsure of how old mom was when she went through menopause. Has 2 older sisters but they haven't gone through menopause yet.  Lt breast mass, states she was supposed to have mammo last year for mass but 'chickened out', reports no changes, not able to feel it today PCP: Bunnie Pion- [facility]       Had labs for PCP this am including TSH, does desire gc/ct testing  Social History: Sexual: heterosexual Marital Status: engaged Living situation: with fiance and children Occupation: call center Tobacco/alcohol: tobacco: 1/2ppd, etoh: weekends Illicit drugs: no history of illicit drug use  The following portions of the patient's history were reviewed and updated as appropriate: allergies, current medications, past family history, past medical history, past social history, past surgical history and problem list.  Past Medical History Past Medical History  Diagnosis Date  . Abnormal Pap smear   . Chlamydia   . Enlarged thyroid   . Trichomonas 10/18/2012  . Enlarged thyroid   . Hx of chlamydia infection   . Vaginal Pap smear, abnormal     Past Surgical History Past Surgical History  Procedure Laterality Date  . Cesarean section    . Endometrial ablation    . Cesarean section with bilateral tubal ligation  2006  . Cholecystectomy      Gynecologic History G2P2  No LMP recorded. Patient has had an ablation. Contraception: s/p endometrial ablation Last Pap: 2015. Results were: neg w/ +HRHPV Last mammogram: never. Results were: n/a Last TCS: never  Obstetric History OB History  Gravida Para Term Preterm AB SAB TAB Ectopic Multiple Living  #  Outcome Date GA Lbr Len/2nd Weight Sex Delivery Anes PTL Lv  2 Para 2006    F CS-LTranv   Y  1 Para 2002    M CS-LTranv   Y      Current Medications No current outpatient prescriptions on file prior to visit.   No current facility-administered medications on file prior to visit.    Review of Systems Patient denies any headaches, blurred vision, shortness of breath, chest pain, abdominal pain, problems with bowel movements, urination, or intercourse.  Objective:  BP 106/64 mmHg  Pulse 72  Ht 5' 3.75" (1.619 m)  Wt 206 lb (93.441 kg)  BMI 35.65 kg/m2 Physical Exam  General:  Well developed, well nourished, no acute distress. She is alert and oriented x3. Skin:  Warm and dry Neck:  Midline trachea, no thyromegaly or nodules Cardiovascular: Regular rate and rhythm, no murmur heard Lungs:  Effort normal, all lung fields clear to auscultation bilaterally Breasts:  No dominant palpable mass, retraction, or nipple discharge. I am unable to feel the mass in Lt breast. There are ridges under both breasts bilaterally and knotty bumps both medial breasts.  Abdomen:  Soft, non tender, no hepatosplenomegaly or masses Pelvic:  External genitalia is normal in appearance.  The vagina is normal in appearance. The cervix is bulbous, no CMT.  Thin prep pap is done w/ HR HPV cotesting. Uterus is felt to be normal size, shape, and contour.  No adnexal masses or tenderness noted. Extremities:  No swelling or varicosities noted Psych:  She has a normal mood and affect  Assessment:   Healthy well-woman exam Lt breast mass- felt by pt Smoker H/O +HRHPV on pap Hot flashes Vaginal dryness  Plan:  F/U w/ PCP to see if TSH abnormal/poss cause of hot flashes Discussed relief measures for hot flashes and vaginal dryness F/U 24yr for physical, or sooner if needed Advised smoking cessation, can call 1-800-quit-now, gave printed cessation tips Mammogram scheduled 10/18 @ 0815 Colonoscopy  or sooner  if problems  Marge Duncans CNM, [facility] 03/28/2015 3:47 PM

## 2015-03-28 NOTE — Patient Instructions (Addendum)
Mammogram 10/18 @ 8:15am, no lotion, deoderant or powder  Cotton pj's, dress in layers, fans  1-800-quit-now  Smoking Cessation, Tips for Success If you are ready to quit smoking, congratulations! You have chosen to help yourself be healthier. Cigarettes bring nicotine, tar, carbon monoxide, and other irritants into your body. Your lungs, heart, and blood vessels will be able to work better without these poisons. There are many different ways to quit smoking. Nicotine gum, nicotine patches, a nicotine inhaler, or nicotine nasal spray can help with physical craving. Hypnosis, support groups, and medicines help break the habit of smoking. WHAT THINGS CAN I DO TO MAKE QUITTING EASIER?  Here are some tips to help you quit for good:  Pick a date when you will quit smoking completely. Tell all of your friends and family about your plan to quit on that date.  Do not try to slowly cut down on the number of cigarettes you are smoking. Pick a quit date and quit smoking completely starting on that day.  Throw away all cigarettes.   Clean and remove all ashtrays from your home, work, and car.  On a card, write down your reasons for quitting. Carry the card with you and read it when you get the urge to smoke.  Cleanse your body of nicotine. Drink enough water and fluids to keep your urine clear or pale yellow. Do this after quitting to flush the nicotine from your body.  Learn to predict your moods. Do not let a bad situation be your excuse to have a cigarette. Some situations in your life might tempt you into wanting a cigarette.  Never have "just one" cigarette. It leads to wanting another and another. Remind yourself of your decision to quit.  Change habits associated with smoking. If you smoked while driving or when feeling stressed, try other activities to replace smoking. Stand up when drinking your coffee. Brush your teeth after eating. Sit in a different chair when you read the paper. Avoid  alcohol while trying to quit, and try to drink fewer caffeinated beverages. Alcohol and caffeine may urge you to smoke.  Avoid foods and drinks that can trigger a desire to smoke, such as sugary or spicy foods and alcohol.  Ask people who smoke not to smoke around you.  Have something planned to do right after eating or having a cup of coffee. For example, plan to take a walk or exercise.  Try a relaxation exercise to calm you down and decrease your stress. Remember, you may be tense and nervous for the first 2 weeks after you quit, but this will pass.  Find new activities to keep your hands busy. Play with a pen, coin, or rubber band. Doodle or draw things on paper.  Brush your teeth right after eating. This will help cut down on the craving for the taste of tobacco after meals. You can also try mouthwash.   Use oral substitutes in place of cigarettes. Try using lemon drops, carrots, cinnamon sticks, or chewing gum. Keep them handy so they are available when you have the urge to smoke.  When you have the urge to smoke, try deep breathing.  Designate your home as a nonsmoking area.  If you are a heavy smoker, ask your health care provider about a prescription for nicotine chewing gum. It can ease your withdrawal from nicotine.  Reward yourself. Set aside the cigarette money you save and buy yourself something nice.  Look for support from others. Join a  support group or smoking cessation program. Ask someone at home or at work to help you with your plan to quit smoking.  Always ask yourself, "Do I need this cigarette or is this just a reflex?" Tell yourself, "Today, I choose not to smoke," or "I do not want to smoke." You are reminding yourself of your decision to quit.  Do not replace cigarette smoking with electronic cigarettes (commonly called e-cigarettes). The safety of e-cigarettes is unknown, and some may contain harmful chemicals.  If you relapse, do not give up! Plan ahead and  think about what you will do the next time you get the urge to smoke. HOW WILL I FEEL WHEN I QUIT SMOKING? You may have symptoms of withdrawal because your body is used to nicotine (the addictive substance in cigarettes). You may crave cigarettes, be irritable, feel very hungry, cough often, get headaches, or have difficulty concentrating. The withdrawal symptoms are only temporary. They are strongest when you first quit but will go away within 10-14 days. When withdrawal symptoms occur, stay in control. Think about your reasons for quitting. Remind yourself that these are signs that your body is healing and getting used to being without cigarettes. Remember that withdrawal symptoms are easier to treat than the major diseases that smoking can cause.  Even after the withdrawal is over, expect periodic urges to smoke. However, these cravings are generally short lived and will go away whether you smoke or not. Do not smoke! WHAT RESOURCES ARE AVAILABLE TO HELP ME QUIT SMOKING? Your health care provider can direct you to community resources or hospitals for support, which may include:  Group support.  Education.  Hypnosis.  Therapy.   This information is not intended to replace advice given to you by your health care provider. Make sure you discuss any questions you have with your health care provider.   Document Released: 03/07/2004 Document Revised: 06/30/2014 Document Reviewed: 11/25/2012 Else[employer] Interactive Patient Education [employer]

## 2015-03-30 LAB — CYTOLOGY - PAP

## 2015-04-03 ENCOUNTER — Telehealth: Payer: Self-pay | Admitting: Women's Health

## 2015-04-03 DIAGNOSIS — R87810 Cervical high risk human papillomavirus (HPV) DNA test positive: Secondary | ICD-10-CM | POA: Insufficient documentation

## 2015-04-03 NOTE — Telephone Encounter (Signed)
Notified pt of neg pap w/ +HRHPV, will need pap in 1 year.  Cheral Marker, CNM, [facility] 04/03/2015 10:39 AM

## 2015-04-10 ENCOUNTER — Ambulatory Visit: Payer: BLUE CROSS/BLUE SHIELD | Admitting: Neurology

## 2015-04-10 ENCOUNTER — Encounter (HOSPITAL_COMMUNITY): Payer: Self-pay

## 2015-05-08 ENCOUNTER — Ambulatory Visit (HOSPITAL_COMMUNITY)
Admission: RE | Admit: 2015-05-08 | Discharge: 2015-05-08 | Disposition: A | Discharge status: Home / Self Care | Payer: BLUE CROSS/BLUE SHIELD | Source: Ambulatory Visit | Attending: Women's Health | Admitting: Women's Health

## 2015-05-08 ENCOUNTER — Encounter (HOSPITAL_COMMUNITY): Payer: Self-pay

## 2015-05-08 ENCOUNTER — Other Ambulatory Visit: Payer: Self-pay | Admitting: Women's Health

## 2015-05-08 DIAGNOSIS — N63 Unspecified lump in unspecified breast: Secondary | ICD-10-CM

## 2015-05-08 DIAGNOSIS — N6320 Unspecified lump in the left breast, unspecified quadrant: Secondary | ICD-10-CM

## 2016-02-28 ENCOUNTER — Other Ambulatory Visit (HOSPITAL_COMMUNITY): Payer: Self-pay | Admitting: Physician Assistant

## 2016-02-28 DIAGNOSIS — R51 Headache: Principal | ICD-10-CM

## 2016-02-28 DIAGNOSIS — R519 Headache, unspecified: Secondary | ICD-10-CM

## 2016-03-06 ENCOUNTER — Ambulatory Visit (HOSPITAL_COMMUNITY)
Admission: RE | Admit: 2016-03-06 | Discharge: 2016-03-06 | Disposition: A | Discharge status: Home / Self Care | Payer: Commercial Managed Care - PPO | Source: Ambulatory Visit | Attending: Physician Assistant | Admitting: Physician Assistant

## 2016-03-06 DIAGNOSIS — R51 Headache: Secondary | ICD-10-CM | POA: Insufficient documentation

## 2016-03-06 DIAGNOSIS — H538 Other visual disturbances: Secondary | ICD-10-CM | POA: Diagnosis present

## 2016-03-06 DIAGNOSIS — G935 Compression of brain: Secondary | ICD-10-CM | POA: Insufficient documentation

## 2016-03-06 DIAGNOSIS — R519 Headache, unspecified: Secondary | ICD-10-CM

## 2016-03-06 DIAGNOSIS — R9082 White matter disease, unspecified: Secondary | ICD-10-CM | POA: Insufficient documentation

## 2016-03-26 ENCOUNTER — Ambulatory Visit (INDEPENDENT_AMBULATORY_CARE_PROVIDER_SITE_OTHER): Payer: Commercial Managed Care - PPO | Admitting: Neurology

## 2016-03-26 ENCOUNTER — Encounter: Payer: Self-pay | Admitting: Neurology

## 2016-03-26 DIAGNOSIS — R55 Syncope and collapse: Secondary | ICD-10-CM | POA: Diagnosis not present

## 2016-03-26 DIAGNOSIS — G43709 Chronic migraine without aura, not intractable, without status migrainosus: Secondary | ICD-10-CM | POA: Diagnosis not present

## 2016-03-26 DIAGNOSIS — R9089 Other abnormal findings on diagnostic imaging of central nervous system: Secondary | ICD-10-CM

## 2016-03-26 DIAGNOSIS — IMO0002 Reserved for concepts with insufficient information to code with codable children: Secondary | ICD-10-CM | POA: Insufficient documentation

## 2016-03-26 MED ORDER — TOPIRAMATE 100 MG PO TABS: 100.0000 mg | ORAL_TABLET | Freq: Two times a day (BID) | ORAL | 11 refills | Status: DC

## 2016-03-26 NOTE — Progress Notes (Signed)
PATIENT: Nina Terry DOB: 1981/04/16  Chief Complaint  Patient presents with  . Abnormal MRI    She is here with her friend, Nina Terry. Reports episodes of small flashing lights over the last month that tend to happen after eating lunch.  They typically last a few seconds.  Her symptoms prompted a MRI of her brain that was abnormal.  Additionally, reports multiple events of loss of consciousness that last for several minutes.  These events have been occurring since she was a teenager.  She estimates it happening twice this year.  She occasionally has word finding difficulty.     HISTORICAL  Nina Terry is a 35 years old right-handed female, accompanied by her longtime friend Nina Terry, seen in refer by her primary care physician Nina Terry for evaluation of abnormal MRI of brain, recurrent spells of passing out, initial visit was March 26 2016   In September 2017, she had episodes of seeing spots in her visual field, lasting for few minutes, this has triggered MRI of brain without contrast, we have personally reviewed the film together, scattered T2/FLAIR hyperintensity lesion in the central inferior pons, and the cerebral hemisphere white matters, frontal predominant, this recent possibility of multiple sclerosis.  Patient denied history of trauma, no visual loss, no strokelike symptoms. She does not have significant vascular risk factors.  She reported a history of frequent passing out since age 35, her friend Nina Terry who has known her for 30 years, had epilepsy disorder herself, described patients as went limp, without body shaking episode, lasting for 5 minutes, mild post event confusion, patient described that before she passed out, she ate is coming on by feeling hot, lightheadedness, lasting for 10 seconds, most recent passing out was 2 months ago. She has spells about twice each year   Laboratory evaluation in September 2017, normal CMP with creatinine 0.7, normal CBC,  hemoglobin 14 point 4, normal TSH 1.25  REVIEW OF SYSTEMS: Full 14 system review of systems performed and notable only for  confusion, headaches, dizziness, passing out, decreased energy, suicidal thoughts, racing thoughts, feeling cold, cramps, allergy, eye pain, fatigue, ringing ears, murmur ALLERGIES: No Known Allergies  HOME MEDICATIONS: No current outpatient prescriptions on file.   No current facility-administered medications for this visit.     PAST MEDICAL HISTORY: Past Medical History:  Diagnosis Date  . Abnormal Pap smear   . Chlamydia   . Enlarged thyroid   . Enlarged thyroid   . Hx of chlamydia infection   . Trichomonas 10/18/2012  . Vaginal Pap smear, abnormal     PAST SURGICAL HISTORY: Past Surgical History:  Procedure Laterality Date  . CESAREAN SECTION    . CESAREAN SECTION WITH BILATERAL TUBAL LIGATION  2006  . CHOLECYSTECTOMY    . CYST EXCISION Right    Armpit  . ENDOMETRIAL ABLATION      FAMILY HISTORY: Family History  Problem Relation Age of Onset  . Adopted: Yes  . Hypertension Mother   . Thyroid disease Mother   . Cancer Sister     cervical   . Cancer Maternal Aunt     breast     SOCIAL HISTORY:  Social History   Social History  . Marital status: Single    Spouse name: N/A  . Number of children: 2  . Years of education: Associates   Occupational History  . [employer]    Social History Main Topics  . Smoking status: Current Every Day Smoker  Packs/day: 0.50    Years: 17.00    Types: Cigarettes  . Smokeless tobacco: Never Used  . Alcohol use Yes     Comment: occ.  . Drug use: No  . Sexual activity: Yes    Birth control/ protection: Surgical   Other Topics Concern  . Not on file   Social History Narrative   Lives at home with children.   Right-handed.   Occasional caffeine use.     PHYSICAL EXAM   Vitals:   03/26/16 1130  BP: 126/86  Pulse: 61  Weight: 216 lb 4 oz (98.1 kg)  Height: 5' 3.75" (1.619 m)     Not recorded      Body mass index is 37.41 kg/m.  PHYSICAL EXAMNIATION:  Gen: NAD, conversant, well nourised, obese, well groomed                     Cardiovascular: Regular rate rhythm, no peripheral edema, warm, nontender. Eyes: Conjunctivae clear without exudates or hemorrhage Neck: Supple, no carotid bruise. Pulmonary: Clear to auscultation bilaterally   NEUROLOGICAL EXAM:  MENTAL STATUS: Speech:    Speech is normal; fluent and spontaneous with normal comprehension.  Cognition:     Orientation to time, place and person     Normal recent and remote memory     Normal Attention span and concentration     Normal Language, naming, repeating,spontaneous speech     Fund of knowledge   CRANIAL NERVES: CN II: Visual fields are full to confrontation. Fundoscopic exam is normal with sharp discs and no vascular changes. Pupils are round equal and briskly reactive to light. CN III, IV, VI: extraocular movement are normal. No ptosis. CN V: Facial sensation is intact to pinprick in all 3 divisions bilaterally. Corneal responses are intact.  CN VII: Face is symmetric with normal eye closure and smile. CN VIII: Hearing is normal to rubbing fingers CN IX, X: Palate elevates symmetrically. Phonation is normal. CN XI: Head turning and shoulder shrug are intact CN XII: Tongue is midline with normal movements and no atrophy.  MOTOR: There is no pronator drift of out-stretched arms. Muscle bulk and tone are normal. Muscle strength is normal.  REFLEXES: Reflexes are 2+ and symmetric at the biceps, triceps, knees, and ankles. Plantar responses are flexor.  SENSORY: Intact to light touch, pinprick, positional sensation and vibratory sensation are intact in fingers and toes.  COORDINATION: Rapid alternating movements and fine finger movements are intact. There is no dysmetria on finger-to-nose and heel-knee-shin.    GAIT/STANCE: Posture is normal. Gait is steady with normal steps, base,  arm swing, and turning. Heel and toe walking are normal. Tandem gait is normal.  Romberg is absent.   DIAGNOSTIC DATA (LABS, IMAGING, TESTING) - I reviewed patient records, labs, notes, testing and imaging myself where available.   ASSESSMENT AND PLAN  Rosario Jacksngela M Jeffcoat is a 35 y.o. female   Recurrent passing out episodes Possible seizure EEG Start Topamax 100 mg twice a day  Abnormal MRI of brain T2/FLAIR hyperintensity signal involving central inferior pontine, supratentorium white matter Small vessel disease, the shape and location could not rule out the possibility of demyelinating process Patient has hyperreflexia on examination, proceed with MRI of cervical spine with and without contrast Laboratory evaluations   Levert FeinsteinYijun Anna Beaird, M.D. Ph.D.  [facility] [address], [address] [place], [place] [address] Ph: [phone] Fax: [phone]  CC: Lovey NewcomerWilliam S Boyd, Nina

## 2016-03-27 ENCOUNTER — Encounter: Payer: Self-pay | Admitting: *Deleted

## 2016-03-27 ENCOUNTER — Other Ambulatory Visit: Payer: Self-pay | Admitting: *Deleted

## 2016-03-27 ENCOUNTER — Telehealth: Payer: Self-pay | Admitting: Neurology

## 2016-03-27 MED ORDER — CYANOCOBALAMIN 1000 MCG/ML IJ SOLN: INTRAMUSCULAR | 0 refills | Status: DC

## 2016-03-27 NOTE — Telephone Encounter (Signed)
Spoke to patient - she is agreeable to both supplements.  She would like to get her B12 injections at her PCP office (for convenience).  I have called Daria PasturesWilliam Boyd, PA at [facility] [phone]) and faxed over orders [phone]).  They will contact the patient to set up her appointments.

## 2016-03-27 NOTE — Telephone Encounter (Signed)
Please call patient laboratory evaluation showed low vitamin B12 161, also low vitamin D level 19.8   She should receive vitamin B IM supplement, 1000 g daily for one week, 1000 g weekly for one month, then 1000 g every months   she should also start vitamin D3 supplement 1000 units daily

## 2016-03-31 LAB — MULTIPLE MYELOMA PANEL, SERUM
ALBUMIN/GLOB SERPL: 1.4 (ref 0.7–1.7)
ALPHA2 GLOB SERPL ELPH-MCNC: 0.8 g/dL (ref 0.4–1.0)
Albumin SerPl Elph-Mcnc: 4 g/dL (ref 2.9–4.4)
Alpha 1: 0.2 g/dL (ref 0.0–0.4)
B-GLOBULIN SERPL ELPH-MCNC: 1 g/dL (ref 0.7–1.3)
GAMMA GLOB SERPL ELPH-MCNC: 0.9 g/dL (ref 0.4–1.8)
GLOBULIN, TOTAL: 2.9 g/dL (ref 2.2–3.9)
IGG (IMMUNOGLOBIN G), SERUM: 904 mg/dL (ref 700–1600)
IgA/Immunoglobulin A, Serum: 212 mg/dL (ref 87–352)
IgM (Immunoglobulin M), Srm: 157 mg/dL (ref 26–217)
TOTAL PROTEIN: 6.9 g/dL (ref 6.0–8.5)

## 2016-03-31 LAB — SEDIMENTATION RATE: SED RATE: 8 mm/h (ref 0–32)

## 2016-03-31 LAB — VITAMIN D 25 HYDROXY (VIT D DEFICIENCY, FRACTURES): Vit D, 25-Hydroxy: 19.8 ng/mL — ABNORMAL LOW (ref 30.0–100.0)

## 2016-03-31 LAB — ANA W/REFLEX IF POSITIVE: ANA: NEGATIVE

## 2016-03-31 LAB — CK: CK TOTAL: 39 U/L (ref 24–173)

## 2016-03-31 LAB — HIV ANTIBODY (ROUTINE TESTING W REFLEX): HIV Screen 4th Generation wRfx: NONREACTIVE

## 2016-03-31 LAB — C-REACTIVE PROTEIN: CRP: 1.3 mg/L (ref 0.0–4.9)

## 2016-03-31 LAB — VITAMIN B12: VITAMIN B 12: 161 pg/mL — AB (ref 211–946)

## 2016-03-31 LAB — FOLATE: Folate: 12.1 ng/mL (ref >3.0)

## 2016-03-31 LAB — RPR: RPR: NONREACTIVE

## 2016-04-08 ENCOUNTER — Telehealth: Payer: Self-pay | Admitting: Neurology

## 2016-04-08 NOTE — Telephone Encounter (Signed)
The patient [facility].. I called Marylene Landngela to inform her that I got in touch with [facility] and I scheduled her appt for Friday 04/11/16 at 7:00 PM check in time at 6:45 PM. I left vmail to inform patient about her appt and that if it is not good for her to call back [facility] scheduler at [phone] to reschedule the time.

## 2016-04-11 ENCOUNTER — Ambulatory Visit (HOSPITAL_COMMUNITY): Payer: BLUE CROSS/BLUE SHIELD

## 2016-04-11 ENCOUNTER — Ambulatory Visit (INDEPENDENT_AMBULATORY_CARE_PROVIDER_SITE_OTHER): Payer: Commercial Managed Care - PPO | Admitting: Neurology

## 2016-04-11 DIAGNOSIS — IMO0002 Reserved for concepts with insufficient information to code with codable children: Secondary | ICD-10-CM

## 2016-04-11 DIAGNOSIS — R9089 Other abnormal findings on diagnostic imaging of central nervous system: Secondary | ICD-10-CM

## 2016-04-11 DIAGNOSIS — G43709 Chronic migraine without aura, not intractable, without status migrainosus: Secondary | ICD-10-CM

## 2016-04-11 DIAGNOSIS — R55 Syncope and collapse: Secondary | ICD-10-CM

## 2016-04-14 NOTE — Procedures (Signed)
   HISTORY: 35 years old female presented with frequent passing out episode.  TECHNIQUE:  16 channel EEG was performed based on standard 10-16 international system. One channel was dedicated to EKG, which has demonstrates normal sinus rhythm of 66 beats per minutes.  Upon awakening, the posterior background activity was well-developed, in alpha range, 9 Hz, reactive to eye opening and closure.  There was no evidence of epileptiform discharge.  Photic stimulation was performed, which induced a symmetric photic driving.  Hyperventilation was performed, there was no abnormality elicit.  No sleep was achieved.  CONCLUSION: This is a  normal awake EEG.  There is no electrodiagnostic evidence of epileptiform discharge.  Levert FeinsteinYijun Kaleyah Labreck, M.D. Ph.D.  [facility] [address] [place], [place] [address] Phone: [phone] Fax:      [phone]

## 2016-05-28 ENCOUNTER — Encounter: Payer: Self-pay | Admitting: Neurology

## 2016-05-28 ENCOUNTER — Ambulatory Visit (INDEPENDENT_AMBULATORY_CARE_PROVIDER_SITE_OTHER): Payer: Commercial Managed Care - PPO | Admitting: Neurology

## 2016-05-28 VITALS — BP 124/82 | HR 69 | Ht 63.75 in | Wt 213.5 lb

## 2016-05-28 DIAGNOSIS — E538 Deficiency of other specified B group vitamins: Secondary | ICD-10-CM

## 2016-05-28 DIAGNOSIS — G43709 Chronic migraine without aura, not intractable, without status migrainosus: Secondary | ICD-10-CM | POA: Diagnosis not present

## 2016-05-28 DIAGNOSIS — R55 Syncope and collapse: Secondary | ICD-10-CM

## 2016-05-28 DIAGNOSIS — IMO0002 Reserved for concepts with insufficient information to code with codable children: Secondary | ICD-10-CM

## 2016-05-28 MED ORDER — RIZATRIPTAN BENZOATE 5 MG PO TBDP: 5.0000 mg | ORAL_TABLET | ORAL | 6 refills | Status: DC | PRN

## 2016-05-28 MED ORDER — ZONISAMIDE 100 MG PO CAPS: 100.0000 mg | ORAL_CAPSULE | Freq: Every day | ORAL | 11 refills | Status: DC

## 2016-05-28 NOTE — Progress Notes (Signed)
PATIENT: Nina Terry DOB: [birth year]  Chief Complaint  Patient presents with  . Passing Out    After taking Topamax 177m at bedtime for three weeks, she had to discontinue the medication.  She was feeling short-tempered and sluggish.  She has not had any further episodes of passing out.  She would like to review her EEG.  She has to call to reschedule her cervical MRI.     HISTORICAL  Nina Terry a 35years old right-handed female, accompanied by her longtime friend Nina Terry seen in refer by her primary care physician PA WLavella Lemonsfor evaluation of abnormal MRI of brain, recurrent spells of passing out, initial visit was March 26 2016   In September 2017, she had episodes of seeing spots in her visual field, lasting for few minutes, this has triggered MRI of brain without contrast, we have personally reviewed the film together, scattered T2/FLAIR hyperintensity lesion in the central inferior pons, and the cerebral hemisphere white matters, frontal predominant, this raised the possibility of multiple sclerosis.  Patient denied history of trauma, no visual loss, no strokelike symptoms. She does not have significant vascular risk factors.  She reported a history of frequent passing out since age 35 her friend Nina Guileswho has known her for 30 years, had epilepsy disorder herself, described patients as went limp, without body shaking episode, lasting for 5 minutes, mild post event confusion, patient described that before she passed out, she ate is coming on by feeling hot, lightheadedness, lasting for 10 seconds, most recent passing out was 2 months ago. She has spells about twice each year   Laboratory evaluation in September 2017, normal CMP with creatinine 0.7, normal CBC, hemoglobin 14 point 4, normal TSH 1.25  UPDATE Dec 6th 2017: She has tried Topamax 1021mqhsx3 weeks, could not tolerate it, she feels really sluggish.  It has helped her headache well. She still has frequent  headaches, ringing in ears, about twice a week,  down right cervical region, it can go up to 8/10, lasting all day, sleeps helps.  I reviewed extensive laboratory evaluation, low vitamin D 19, low vitamin B12 161, she is receiving IM supplement, rest of the extensive laboratory evaluations were normal, this including C-reactive protein, ESR, folic acid, HIV, RPR, ANA, CPK, Lyme titer, protein electrophoresis.  REVIEW OF SYSTEMS: Full 14 system review of systems performed and notable only for  Headache, depression anxiety, ringing in ears  ALLERGIES: No Known Allergies  HOME MEDICATIONS: Current Outpatient Prescriptions  Medication Sig Dispense Refill  . Cholecalciferol (VITAMIN D-3) 1000 units CAPS Take by mouth daily.    . cyanocobalamin (,VITAMIN B-12,) 1000 MCG/ML injection Inject 100032mdaily for one week, then inject 1000m25meekly for one month, then inject 1000mc36mnthly for one year. 1 mL 0   No current facility-administered medications for this visit.     PAST MEDICAL HISTORY: Past Medical History:  Diagnosis Date  . Abnormal Pap smear   . Chlamydia   . Enlarged thyroid   . Enlarged thyroid   . Hx of chlamydia infection   . Trichomonas 10/18/2012  . Vaginal Pap smear, abnormal     PAST SURGICAL HISTORY: Past Surgical History:  Procedure Laterality Date  . CESAREAN SECTION    . CESAREAN SECTION WITH BILATERAL TUBAL LIGATION  2006  . CHOLECYSTECTOMY    . CYST EXCISION Right    Armpit  . ENDOMETRIAL ABLATION      FAMILY HISTORY: Family History  Problem Relation Age of Onset  . Adopted: Yes  . Hypertension Mother   . Thyroid disease Mother   . Cancer Sister     cervical   . Cancer Maternal Aunt     breast     SOCIAL HISTORY:  Social History   Social History  . Marital status: Single    Spouse name: N/A  . Number of children: 2  . Years of education: Associates   Occupational History  . [employer]    Social History Main Topics  . Smoking  status: Current Every Day Smoker    Packs/day: 0.50    Years: 17.00    Types: Cigarettes  . Smokeless tobacco: Never Used  . Alcohol use Yes     Comment: occ.  . Drug use: No  . Sexual activity: Yes    Birth control/ protection: Surgical   Other Topics Concern  . Not on file   Social History Narrative   Lives at home with children.   Right-handed.   Occasional caffeine use.     PHYSICAL EXAM   Vitals:   05/28/16 1212  BP: 124/82  Pulse: 69  Weight: 213 lb 8 oz (96.8 kg)  Height: 5' 3.75" (1.619 m)    Not recorded      Body mass index is 36.94 kg/m.  PHYSICAL EXAMNIATION:  Gen: NAD, conversant, well nourised, obese, well groomed                     Cardiovascular: Regular rate rhythm, no peripheral edema, warm, nontender. Eyes: Conjunctivae clear without exudates or hemorrhage Neck: Supple, no carotid bruise. Pulmonary: Clear to auscultation bilaterally   NEUROLOGICAL EXAM:  MENTAL STATUS: Speech:    Speech is normal; fluent and spontaneous with normal comprehension.  Cognition:     Orientation to time, place and person     Normal recent and remote memory     Normal Attention span and concentration     Normal Language, naming, repeating,spontaneous speech     Fund of knowledge   CRANIAL NERVES: CN II: Visual fields are full to confrontation. Fundoscopic exam is normal with sharp discs and no vascular changes. Pupils are round equal and briskly reactive to light. CN III, IV, VI: extraocular movement are normal. No ptosis. CN V: Facial sensation is intact to pinprick in all 3 divisions bilaterally. Corneal responses are intact.  CN VII: Face is symmetric with normal eye closure and smile. CN VIII: Hearing is normal to rubbing fingers CN IX, X: Palate elevates symmetrically. Phonation is normal. CN XI: Head turning and shoulder shrug are intact CN XII: Tongue is midline with normal movements and no atrophy.  MOTOR: There is no pronator drift of  out-stretched arms. Muscle bulk and tone are normal. Muscle strength is normal.  REFLEXES: Reflexes are 2+ and symmetric at the biceps, triceps, knees, and ankles. Plantar responses are flexor.  SENSORY: Intact to light touch, pinprick, positional sensation and vibratory sensation are intact in fingers and toes.  COORDINATION: Rapid alternating movements and fine finger movements are intact. There is no dysmetria on finger-to-nose and heel-knee-shin.    GAIT/STANCE: Posture is normal. Gait is steady with normal steps, base, arm swing, and turning. Heel and toe walking are normal. Tandem gait is normal.  Romberg is absent.   DIAGNOSTIC DATA (LABS, IMAGING, TESTING) - I reviewed patient records, labs, notes, testing and imaging myself where available.   ASSESSMENT AND PLAN  Nina Terry is a 35  y.o. female   Recurrent passing out episodes Possible seizure EEG was normal  Chronic migraine headaches She could not tolerate Topamax 100 mg every night, complains of drowsiness Will try Zonegran 100 mg twice a day as preventive medications, hope to help her passing out spells too Maxalt 5 mg as needed  Abnormal MRI of brain T2/FLAIR hyperintensity signal involving central inferior pontine, supratentorium white matter Possible small vessel disease, the shape and location could not rule out the possibility of demyelinating process Patient has hyperreflexia on examination, proceed with MRI of cervical spine with and without contrast  Vitamin B12 deficiency, vitamin D deficiency  Is on supplement now   Marcial Pacas, M.D. Ph.D.  [facility] [address] [place], [place] [address] Ph: [phone] Fax: [phone]  CC: Lavella Lemons, PA

## 2016-07-23 DIAGNOSIS — D519 Vitamin B12 deficiency anemia, unspecified: Secondary | ICD-10-CM | POA: Diagnosis not present

## 2016-08-26 ENCOUNTER — Ambulatory Visit: Payer: Commercial Managed Care - PPO | Admitting: Adult Health

## 2016-08-27 DIAGNOSIS — D519 Vitamin B12 deficiency anemia, unspecified: Secondary | ICD-10-CM | POA: Diagnosis not present

## 2016-09-29 DIAGNOSIS — D519 Vitamin B12 deficiency anemia, unspecified: Secondary | ICD-10-CM | POA: Diagnosis not present

## 2016-11-06 DIAGNOSIS — D519 Vitamin B12 deficiency anemia, unspecified: Secondary | ICD-10-CM | POA: Diagnosis not present

## 2017-03-25 DIAGNOSIS — K219 Gastro-esophageal reflux disease without esophagitis: Secondary | ICD-10-CM | POA: Diagnosis not present

## 2017-03-25 DIAGNOSIS — D519 Vitamin B12 deficiency anemia, unspecified: Secondary | ICD-10-CM | POA: Diagnosis not present

## 2017-03-25 DIAGNOSIS — R55 Syncope and collapse: Secondary | ICD-10-CM | POA: Diagnosis not present

## 2017-08-04 DIAGNOSIS — R5383 Other fatigue: Secondary | ICD-10-CM | POA: Diagnosis not present

## 2017-08-04 DIAGNOSIS — K219 Gastro-esophageal reflux disease without esophagitis: Secondary | ICD-10-CM | POA: Diagnosis not present

## 2017-08-04 DIAGNOSIS — R590 Localized enlarged lymph nodes: Secondary | ICD-10-CM | POA: Diagnosis not present

## 2017-08-05 ENCOUNTER — Other Ambulatory Visit (HOSPITAL_COMMUNITY): Payer: Self-pay | Admitting: Physician Assistant

## 2017-08-05 DIAGNOSIS — R590 Localized enlarged lymph nodes: Secondary | ICD-10-CM

## 2017-08-07 ENCOUNTER — Ambulatory Visit (HOSPITAL_COMMUNITY)
Admission: RE | Admit: 2017-08-07 | Discharge: 2017-08-07 | Disposition: A | Discharge status: Home / Self Care | Payer: Commercial Managed Care - PPO | Source: Ambulatory Visit | Attending: Physician Assistant | Admitting: Physician Assistant

## 2017-08-07 DIAGNOSIS — R590 Localized enlarged lymph nodes: Secondary | ICD-10-CM | POA: Diagnosis present

## 2017-08-07 DIAGNOSIS — R599 Enlarged lymph nodes, unspecified: Secondary | ICD-10-CM | POA: Diagnosis not present

## 2017-08-13 ENCOUNTER — Other Ambulatory Visit (HOSPITAL_COMMUNITY)
Admission: RE | Admit: 2017-08-13 | Discharge: 2017-08-13 | Disposition: A | Discharge status: Home / Self Care | Payer: Commercial Managed Care - PPO | Source: Ambulatory Visit | Attending: Adult Health | Admitting: Adult Health

## 2017-08-13 ENCOUNTER — Other Ambulatory Visit: Payer: Self-pay

## 2017-08-13 ENCOUNTER — Encounter: Payer: Self-pay | Admitting: Adult Health

## 2017-08-13 ENCOUNTER — Ambulatory Visit (INDEPENDENT_AMBULATORY_CARE_PROVIDER_SITE_OTHER): Payer: Commercial Managed Care - PPO | Admitting: Adult Health

## 2017-08-13 VITALS — BP 126/80 | HR 80 | Resp 18 | Ht 63.0 in | Wt 216.0 lb

## 2017-08-13 DIAGNOSIS — Z01419 Encounter for gynecological examination (general) (routine) without abnormal findings: Secondary | ICD-10-CM | POA: Diagnosis not present

## 2017-08-13 DIAGNOSIS — F329 Major depressive disorder, single episode, unspecified: Secondary | ICD-10-CM

## 2017-08-13 DIAGNOSIS — Z01411 Encounter for gynecological examination (general) (routine) with abnormal findings: Secondary | ICD-10-CM | POA: Diagnosis not present

## 2017-08-13 DIAGNOSIS — F32A Depression, unspecified: Secondary | ICD-10-CM

## 2017-08-13 MED ORDER — SERTRALINE HCL 50 MG PO TABS: 50.0000 mg | ORAL_TABLET | Freq: Every day | ORAL | 6 refills | Status: DC

## 2017-08-13 NOTE — Progress Notes (Signed)
Patient ID: Nina Terry, female   DOB: 10/12/1980, 37 y.o.   MRN: [id number] History of Present Illness: Nina Terry is a 37 year old white female,single. G2P2,  in for a well woman gyn exam and pap. PCP is Daria PasturesWilliam Boyd, PA at [facility].    Current Medications, Allergies, Past Medical History, Past Surgical History, Family History and Social History were reviewed in [facility] electronic medical record.     Review of Systems: Patient denies any headaches, hearing loss, fatigue, blurred vision, shortness of breath, chest pain, abdominal pain, problems with bowel movements, urination, or intercourse. No joint pain or mood swings.    Physical Exam:BP 126/80 (BP Location: Right Arm, Patient Position: Sitting, Cuff Size: Large)   Pulse 80   Resp 18   Ht 5\' 3"  (1.6 m)   Wt 216 lb (98 kg)   BMI 38.26 kg/m  General:  Well developed, well nourished, no acute distress Skin:  Warm and dry Neck:  Midline trachea, normal thyroid, good ROM, no lymphadenopathy Lungs; Clear to auscultation bilaterally Breast:  No dominant palpable mass, retraction, or nipple discharge Cardiovascular: Regular rate and rhythm Abdomen:  Soft, non tender, no hepatosplenomegaly Pelvic:  External genitalia is normal in appearance, no lesions.  The vagina is normal in appearance. Urethra has no lesions or masses. The cervix is bulbous. Pap with HPV and GC/CHL performed. Uterus is felt to be normal size, shape, and contour.  No adnexal masses or tenderness noted.Bladder is non tender, no masses felt. Extremities/musculoskeletal:  No swelling or varicosities noted, no clubbing or cyanosis Psych:  No mood changes, alert and cooperative,seems happy PHQ 9 score 9, denies being suicidal or homicidal, and she is open to meds, will try Zoloft.Has been angry and teary at times, and can just stay in bed on Saturdays.   Impression:  1. Encounter for gynecological examination with Papanicolaou smear of cervix   2. Depression,  unspecified depression type      Plan: Meds ordered this encounter  Medications  . sertraline (ZOLOFT) 50 MG tablet    Sig: Take 1 tablet (50 mg total) by mouth daily.    Dispense:  30 tablet    Refill:  6    Order Specific Question:   Supervising Provider    Answer:   Duane LopeEURE, LUTHER H [[id number]]  F/U in 6 weeks Physical in 1 year Pap in 3 if normal Labs with PCP

## 2017-08-18 ENCOUNTER — Telehealth: Payer: Self-pay | Admitting: Adult Health

## 2017-08-18 LAB — CYTOLOGY - PAP
CHLAMYDIA, DNA PROBE: NEGATIVE
Diagnosis: NEGATIVE
HPV (WINDOPATH): DETECTED — AB
NEISSERIA GONORRHEA: NEGATIVE

## 2017-08-18 NOTE — Telephone Encounter (Signed)
Pt aware pap is negative for malignancy,GC/CHL but +HPV, needs colpo, to assess,appt made

## 2017-08-31 ENCOUNTER — Encounter: Payer: Commercial Managed Care - PPO | Admitting: Obstetrics and Gynecology

## 2017-09-14 ENCOUNTER — Encounter: Payer: Commercial Managed Care - PPO | Admitting: Obstetrics and Gynecology

## 2017-09-24 ENCOUNTER — Encounter: Payer: Self-pay | Admitting: Adult Health

## 2017-09-24 ENCOUNTER — Ambulatory Visit: Payer: Commercial Managed Care - PPO | Admitting: Adult Health

## 2017-09-24 VITALS — BP 130/88 | HR 67 | Ht 63.0 in | Wt 212.5 lb

## 2017-09-24 DIAGNOSIS — F329 Major depressive disorder, single episode, unspecified: Secondary | ICD-10-CM

## 2017-09-24 DIAGNOSIS — F32A Depression, unspecified: Secondary | ICD-10-CM

## 2017-09-24 NOTE — Progress Notes (Signed)
Subjective:     Patient ID: Nina Terry, female   DOB: 04/07/1981, 37 y.o.   MRN: [id number]  HPI Nina Terry is a 37 year old white female back in follow up of starting zoloft and she says she is feeling much better.   Review of Systems Feels much better Is able to get out of bed and do things Reviewed past medical,surgical, social and family history. Reviewed medications and allergies.     Objective:   Physical Exam BP 130/88 (BP Location: Left Arm, Patient Position: Sitting, Cuff Size: Large)   Pulse 67   Ht 5\' 3"  (1.6 m)   Wt 212 lb 8 oz (96.4 kg)   BMI 37.64 kg/m  Skin warm and dry.  Lungs: clear to ausculation bilaterally. Cardiovascular: regular rate and rhythm. PHQ 9 score 8, denies being suicidal or homicidal feels much better, is smiling today.    Assessment:     1. Depression, unspecified depression type       Plan:     Continue Zoloft F/U in 8 weeks or before if needed

## 2017-11-10 DIAGNOSIS — L732 Hidradenitis suppurativa: Secondary | ICD-10-CM | POA: Diagnosis not present

## 2017-11-10 DIAGNOSIS — L03111 Cellulitis of right axilla: Secondary | ICD-10-CM | POA: Diagnosis not present

## 2017-11-10 DIAGNOSIS — L0291 Cutaneous abscess, unspecified: Secondary | ICD-10-CM | POA: Diagnosis not present

## 2017-11-11 DIAGNOSIS — L02419 Cutaneous abscess of limb, unspecified: Secondary | ICD-10-CM | POA: Diagnosis not present

## 2017-11-12 DIAGNOSIS — L02411 Cutaneous abscess of right axilla: Secondary | ICD-10-CM | POA: Diagnosis not present

## 2017-11-12 DIAGNOSIS — Z79899 Other long term (current) drug therapy: Secondary | ICD-10-CM | POA: Diagnosis not present

## 2017-11-13 DIAGNOSIS — L02411 Cutaneous abscess of right axilla: Secondary | ICD-10-CM | POA: Diagnosis not present

## 2017-11-13 DIAGNOSIS — Z79899 Other long term (current) drug therapy: Secondary | ICD-10-CM | POA: Diagnosis not present

## 2017-11-14 DIAGNOSIS — Z4801 Encounter for change or removal of surgical wound dressing: Secondary | ICD-10-CM | POA: Diagnosis not present

## 2017-11-14 DIAGNOSIS — L02411 Cutaneous abscess of right axilla: Secondary | ICD-10-CM | POA: Diagnosis not present

## 2017-11-15 DIAGNOSIS — Z4801 Encounter for change or removal of surgical wound dressing: Secondary | ICD-10-CM | POA: Diagnosis not present

## 2017-11-15 DIAGNOSIS — L02411 Cutaneous abscess of right axilla: Secondary | ICD-10-CM | POA: Diagnosis not present

## 2017-11-16 DIAGNOSIS — Z4801 Encounter for change or removal of surgical wound dressing: Secondary | ICD-10-CM | POA: Diagnosis not present

## 2017-11-16 DIAGNOSIS — L02411 Cutaneous abscess of right axilla: Secondary | ICD-10-CM | POA: Diagnosis not present

## 2017-11-17 ENCOUNTER — Ambulatory Visit: Payer: Commercial Managed Care - PPO | Admitting: Adult Health

## 2017-11-24 ENCOUNTER — Ambulatory Visit: Payer: Commercial Managed Care - PPO | Admitting: Adult Health

## 2017-12-02 ENCOUNTER — Ambulatory Visit: Payer: Commercial Managed Care - PPO | Admitting: Adult Health

## 2017-12-09 DIAGNOSIS — L732 Hidradenitis suppurativa: Secondary | ICD-10-CM | POA: Diagnosis not present

## 2017-12-10 ENCOUNTER — Ambulatory Visit: Payer: Commercial Managed Care - PPO | Admitting: Adult Health

## 2017-12-10 ENCOUNTER — Encounter (INDEPENDENT_AMBULATORY_CARE_PROVIDER_SITE_OTHER): Payer: Self-pay

## 2017-12-10 ENCOUNTER — Encounter: Payer: Self-pay | Admitting: Adult Health

## 2017-12-10 VITALS — BP 118/74 | HR 67 | Ht 63.0 in | Wt 219.0 lb

## 2017-12-10 DIAGNOSIS — F329 Major depressive disorder, single episode, unspecified: Secondary | ICD-10-CM | POA: Diagnosis not present

## 2017-12-10 DIAGNOSIS — F32A Depression, unspecified: Secondary | ICD-10-CM

## 2017-12-10 MED ORDER — SERTRALINE HCL 50 MG PO TABS: 50.0000 mg | ORAL_TABLET | Freq: Every day | ORAL | 2 refills | Status: DC

## 2017-12-10 NOTE — Progress Notes (Signed)
  Subjective:     Patient ID: Rosario Jacksngela M Ornstein, female   DOB: 05/23/1981, 37 y.o.   MRN: [id number]  HPI Marylene Landngela is a 37 year old white female back in follow up on taking zoloft and is good, so much better she says.   Review of Systems Feels better, no anger Reviewed past medical,surgical, social and family history. Reviewed medications and allergies.     Objective:   Physical Exam BP 118/74 (BP Location: Left Arm, Patient Position: Sitting, Cuff Size: Large)   Pulse 67   Ht 5\' 3"  (1.6 m)   Wt 219 lb (99.3 kg)   BMI 38.79 kg/m Talk only:   PHQ 9 score 8, which is the same as in April and she says she feels good, has been cleaning house out and is not angry any more, wants to continue at same dose.She is smiling and looks happy.   Assessment:     1. Depression, unspecified depression type       Plan:     Meds ordered this encounter  Medications  . sertraline (ZOLOFT) 50 MG tablet    Sig: Take 1 tablet (50 mg total) by mouth daily.    Dispense:  90 tablet    Refill:  2    Order Specific Question:   Supervising Provider    Answer:   Despina HiddenEURE, LUTHER H [[id number]]  F/U in 4 months

## 2018-04-13 ENCOUNTER — Other Ambulatory Visit: Payer: Commercial Managed Care - PPO | Admitting: Adult Health

## 2018-05-26 ENCOUNTER — Ambulatory Visit (INDEPENDENT_AMBULATORY_CARE_PROVIDER_SITE_OTHER): Payer: Commercial Managed Care - PPO | Admitting: Adult Health

## 2018-05-26 ENCOUNTER — Encounter: Payer: Self-pay | Admitting: Adult Health

## 2018-05-26 VITALS — BP 136/84 | HR 66 | Ht 63.0 in | Wt 231.0 lb

## 2018-05-26 DIAGNOSIS — F329 Major depressive disorder, single episode, unspecified: Secondary | ICD-10-CM | POA: Diagnosis not present

## 2018-05-26 DIAGNOSIS — R87810 Cervical high risk human papillomavirus (HPV) DNA test positive: Secondary | ICD-10-CM | POA: Insufficient documentation

## 2018-05-26 DIAGNOSIS — L0292 Furuncle, unspecified: Secondary | ICD-10-CM | POA: Insufficient documentation

## 2018-05-26 DIAGNOSIS — F32A Depression, unspecified: Secondary | ICD-10-CM

## 2018-05-26 MED ORDER — SULFAMETHOXAZOLE-TRIMETHOPRIM 800-160 MG PO TABS
1.0000 | ORAL_TABLET | Freq: Two times a day (BID) | ORAL | 1 refills | Status: DC
Start: 2018-05-26 — End: 2019-10-25

## 2018-05-26 MED ORDER — SERTRALINE HCL 50 MG PO TABS: 50.0000 mg | ORAL_TABLET | Freq: Every day | ORAL | 2 refills | Status: DC

## 2018-05-26 NOTE — Progress Notes (Signed)
  Subjective:     Patient ID: Nina Terry, female   DOB: 11/04/1980, 37 y.o.   MRN: [id number]  HPI Marylene Landngela is a 37 year old white female, back in follow up on Zoloft and is doing good she says.   Review of Systems Feels better Has boil on stomach Has not gotten colpo yet for +HPV on pap Reviewed past medical,surgical, social and family history. Reviewed medications and allergies.     Objective:   Physical Exam BP 136/84 (BP Location: Right Arm, Patient Position: Sitting, Cuff Size: Large)   Pulse 66   Ht 5\' 3"  (1.6 m)   Wt 231 lb (104.8 kg)   BMI 40.92 kg/m   Fall risk low. PHQ 9 score is 10, she is on Zoloft 50 mg and feels that it is good, denies being suicidal or homicidal.She does not feel like increase in meds needed.  Skin warm and dry, has boil under panniculus on right, at old  C section scar. Will rx septra ds, use dial soap.     Assessment:     1. Depression, unspecified depression type   2. Boil   3. Cervical high risk human papillomavirus (HPV) DNA test positive       Plan:     Meds ordered this encounter  Medications  . sulfamethoxazole-trimethoprim (BACTRIM DS,SEPTRA DS) 800-160 MG tablet    Sig: Take 1 tablet by mouth 2 (two) times daily. Take 1 bid    Dispense:  28 tablet    Refill:  1    Order Specific Question:   Supervising Provider    Answer:   EURE, LUTHER H [[id number]]  . sertraline (ZOLOFT) 50 MG tablet    Sig: Take 1 tablet (50 mg total) by mouth daily.    Dispense:  90 tablet    Refill:  2    Order Specific Question:   Supervising Provider    Answer:   Lazaro ArmsEURE, LUTHER H [[id number]]  Return in 2 weeks for colpo with Dr Emelda FearFerguson  Physical in February

## 2018-06-03 DIAGNOSIS — R079 Chest pain, unspecified: Secondary | ICD-10-CM | POA: Diagnosis not present

## 2018-06-03 DIAGNOSIS — R1030 Lower abdominal pain, unspecified: Secondary | ICD-10-CM | POA: Diagnosis not present

## 2018-06-03 DIAGNOSIS — S3991XA Unspecified injury of abdomen, initial encounter: Secondary | ICD-10-CM | POA: Diagnosis not present

## 2018-06-03 DIAGNOSIS — R109 Unspecified abdominal pain: Secondary | ICD-10-CM | POA: Diagnosis not present

## 2018-06-03 DIAGNOSIS — S300XXA Contusion of lower back and pelvis, initial encounter: Secondary | ICD-10-CM | POA: Diagnosis not present

## 2018-06-08 ENCOUNTER — Telehealth: Payer: Self-pay | Admitting: Obstetrics and Gynecology

## 2018-06-08 ENCOUNTER — Encounter: Payer: Commercial Managed Care - PPO | Admitting: Obstetrics and Gynecology

## 2018-06-08 NOTE — Telephone Encounter (Signed)
Pt did NOT keep colpo Appt. Call made to pt offering to discuss future monitoring. Message left for pt to call. As per [facility]., normal paps with + HPV are acceptably monitored with repeating pap and HPV in 1 yr, or by checking HPV subtype , and colpo is done on all who have HPV types 16 or 18.

## 2019-05-16 IMAGING — US US SOFT TISSUE HEAD/NECK
1 series · 14 of 14 positions shown · non-contrast
Comparison: None.

CLINICAL DATA: 36-year-old female with a history of lymphadenopathy
for 2 months

EXAM:
ULTRASOUND OF HEAD/NECK SOFT TISSUES
TECHNIQUE: Ultrasound examination of the head and neck soft tissues was
performed in the area of clinical concern.

[Series 1: us soft tissue head/neck · 0.04mm/px · 14 acquisitions, 14 frames shown]
[im 1/14]
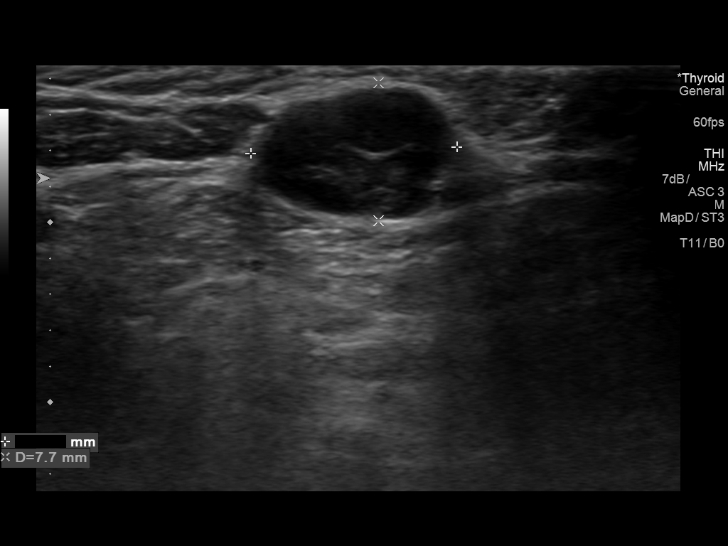
[im 2/14]
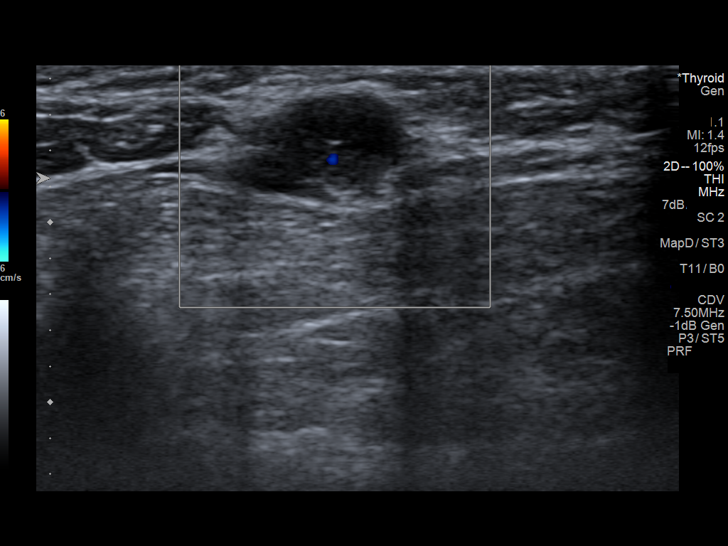
[im 3/14]
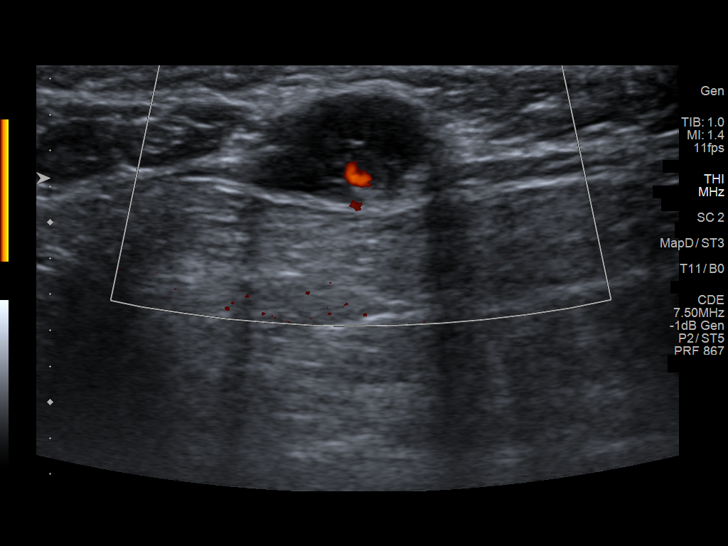
[im 4/14]
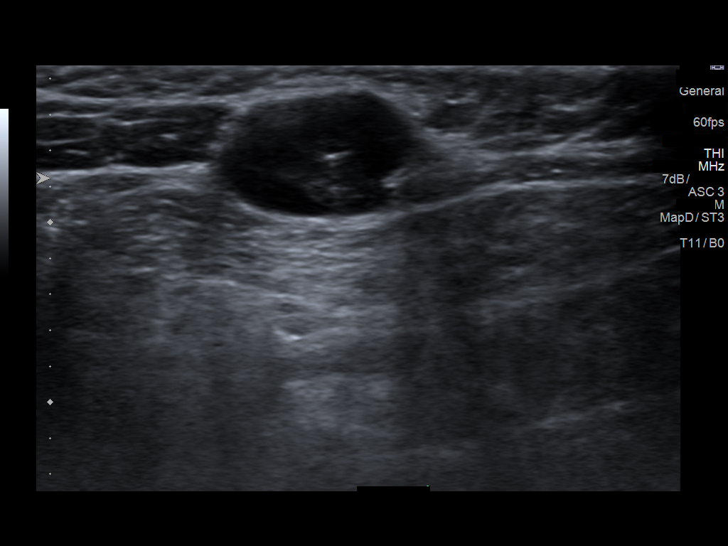
[im 5/14]
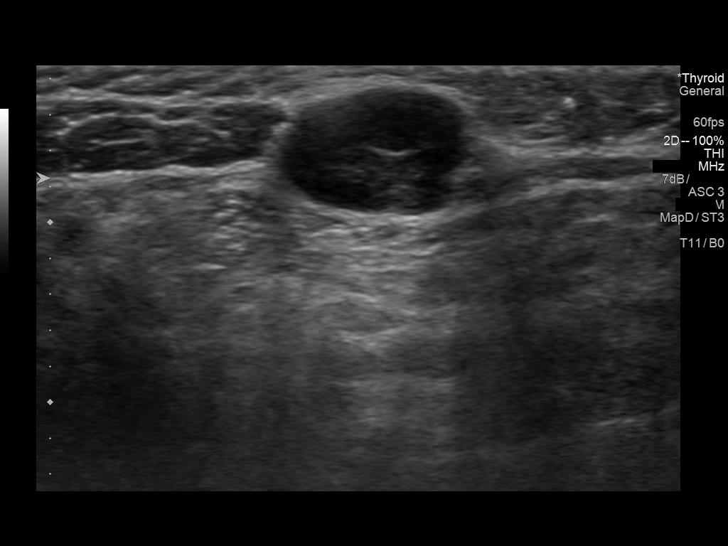
[im 6/14]
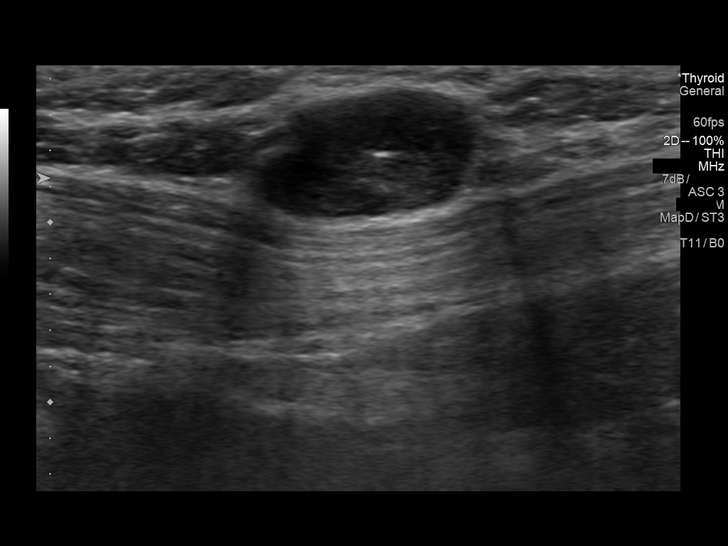
[im 7/14]
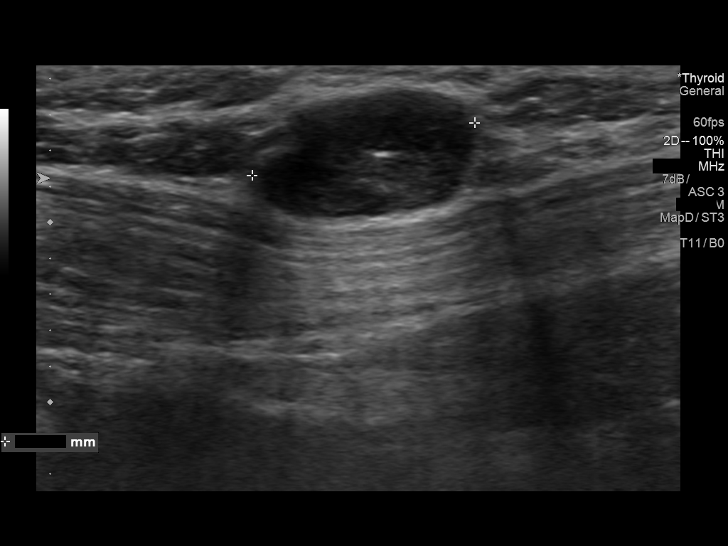
[im 8/14]
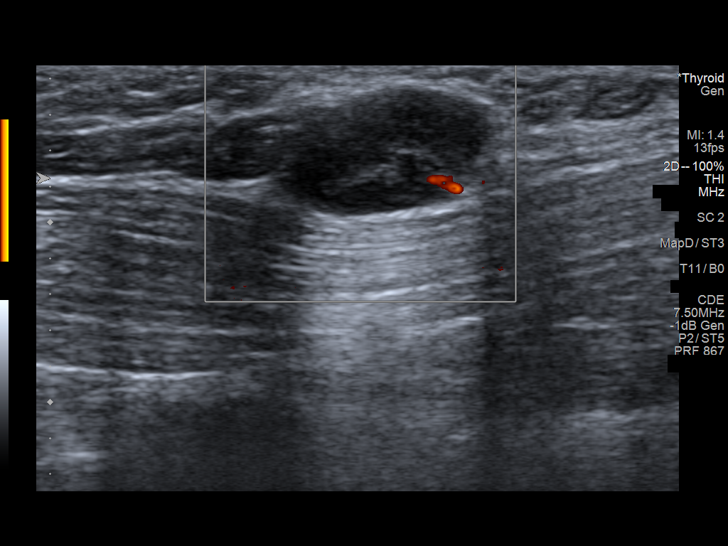
[im 9/14]
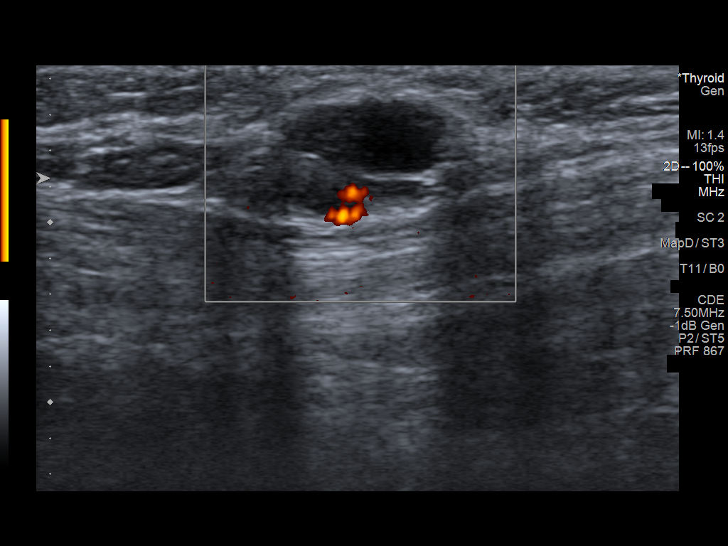
[im 10/14]
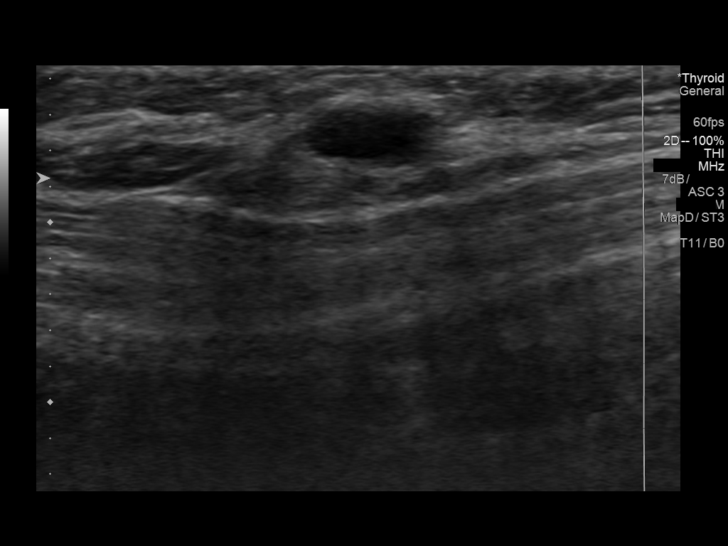
[im 11/14]
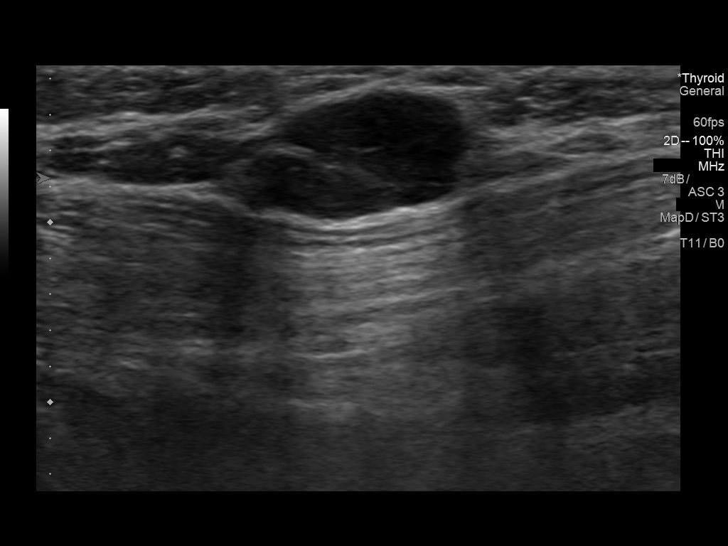
[im 12/14]
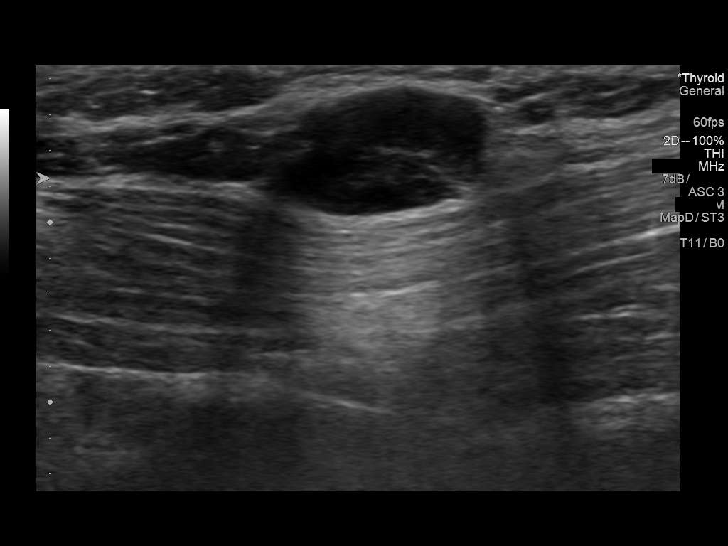
[im 13/14]
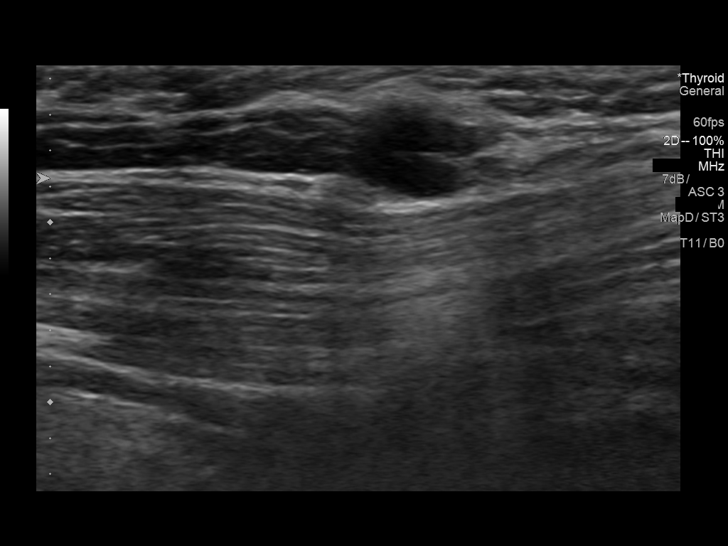
[im 14/14]
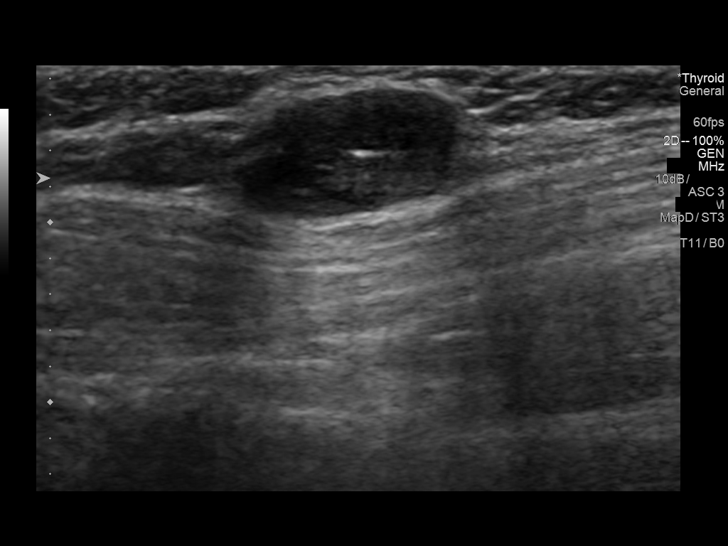

[14 of 14 positions shown; findings below may reference images not displayed]

FINDINGS: Grayscale and color duplex ultrasound performed in the region of
clinical concern.

There is a heterogeneously hypoechoic rounded soft tissue lesion
with a hyperechoic hilum and no significant internal flow overlying
the musculature. This measures 1.2 cm x 8 mm x 1.3 cm.

No focal fluid collection.
IMPRESSION: Ultrasound survey demonstrates soft tissue nodule in the region of
clinical concern. Although most likely a reactive lymph node,
ultrasound characteristics are nonspecific, and cannot confer
benignity or malignancy.

## 2019-10-25 ENCOUNTER — Encounter: Payer: Self-pay | Admitting: Neurology

## 2019-10-25 ENCOUNTER — Ambulatory Visit: Payer: Commercial Managed Care - PPO | Admitting: Neurology

## 2019-10-25 ENCOUNTER — Other Ambulatory Visit: Payer: Self-pay

## 2019-10-25 VITALS — BP 117/76 | HR 65 | Temp 97.6°F | Ht 63.0 in | Wt 214.5 lb

## 2019-10-25 DIAGNOSIS — R202 Paresthesia of skin: Secondary | ICD-10-CM | POA: Insufficient documentation

## 2019-10-25 DIAGNOSIS — Q0700 Arnold-Chiari syndrome without spina bifida or hydrocephalus: Secondary | ICD-10-CM | POA: Diagnosis not present

## 2019-10-25 NOTE — Progress Notes (Signed)
PATIENT: Nina Terry DOB: 1980-10-12  Chief Complaint  Patient presents with  . Peripheral Neuropathy    She is has burning, tingling and numbness in bilateral feet. Gabapentin 100mg  at bedtime is mildy helpful.   PCP    Marland Kitchen, PA (Pt seen here in 2017 for migraine/abnormal MRI)     HISTORICAL  Nina Terry is a 39 year old female, seen in request by her primary care PA 30 for evaluation of bilateral feet paresthesia, initial evaluation was on Oct 25, 2019.  I have reviewed and summarized the referring note from the referring physician.  I saw her previously in 2017 for reported episode of seeing spots in her visual field, which has leading to her MRI scan, showed findings consistent with Arnold-Chiari malformation, extensive laboratory evaluation at that time showed vitamin B12 deficiency, she received IM supplement, rest of the laboratory evaluation showed no significant abnormalities,, C-reactive protein, folic acid, HIV, RPR, ANA, CPK, Lyme titer, protein electrophoresis  She also complains of frequent passing out spells, there was witnessed episode by her friends who suffered epilepsy disorder herself, described patient as went limp, whole body shaking with mild postevent confusion, suggestive of seizure-like event, EEG at that time was normal, she was giving a trial of Topamax, she could not tolerate it even 100 mg every night, complains of feeling sluggish, has stopped the medication and lost follow-up  Since her ablation surgery for her heavy menstruation, her passing out spells has much improved,  Today she complains of 3 months history of bilateral feet paresthesia, started around February 2021, there was no clear triggers, involving distal foot, bilateral toes, plantar surface, symmetric, she described tingly burning stinging sensation, to the point of painful, especially after bearing weight, she also has the urge to move her leg after prolonged  sitting, while trying to go to sleep, she was given gabapentin 100 mg every night, she take it intermittently, which was helpful  I personally reviewed MRI of the brain in September 2017, there was no acute intracranial abnormality, Arnold-Chiari malformation with cerebellar tonsillar extension 6 mm below the foramen magnum, mild crowding of the brainstem and cerebellar tonsil, no evidence of compression.   Laboratory evaluations in March 2021, normal CMP, CBC, hemoglobin of 13.8, LDL of 84, TSH of 1.45, vitamin D was 13.6  REVIEW OF SYSTEMS: Full 14 system review of systems performed and notable only for as above All other review of systems were negative.  ALLERGIES: No Known Allergies  HOME MEDICATIONS: Current Outpatient Medications  Medication Sig Dispense Refill  . gabapentin (NEURONTIN) 100 MG capsule Take 100 mg by mouth at bedtime as needed.    08-21-1980 VITAMIN D PO Take 2,000 Units by mouth daily.     No current facility-administered medications for this visit.    PAST MEDICAL HISTORY: Past Medical History:  Diagnosis Date  . Abnormal Pap smear   . Chlamydia   . Enlarged thyroid   . Enlarged thyroid   . History of Chiari malformation   . Hx of chlamydia infection   . Migraine   . Peripheral neuropathy   . Trichomonas 10/18/2012  . Vaginal Pap smear, abnormal   . Vitamin D deficiency     PAST SURGICAL HISTORY: Past Surgical History:  Procedure Laterality Date  . CESAREAN SECTION    . CESAREAN SECTION WITH BILATERAL TUBAL LIGATION  2006  . CHOLECYSTECTOMY    . CYST EXCISION Right    Armpit  . DENTAL  SURGERY    . ENDOMETRIAL ABLATION      FAMILY HISTORY: Family History  Adopted: Yes  Problem Relation Age of Onset  . Hypertension Mother   . Thyroid disease Mother   . Cancer Sister        cervical   . Other Father        car accident  . Cancer Maternal Aunt        breast     SOCIAL HISTORY: Social History   Socioeconomic History  . Marital status: Single     Spouse name: Not on file  . Number of children: 2  . Years of education: Associates  . Highest education level: Not on file  Occupational History  . Occupation: Call Center  Tobacco Use  . Smoking status: Current Every Day Smoker    Packs/day: 0.50    Years: 17.00    Pack years: 8.50    Types: Cigarettes  . Smokeless tobacco: Never Used  Substance and Sexual Activity  . Alcohol use: Yes    Comment: occasional  . Drug use: No  . Sexual activity: Yes    Birth control/protection: Surgical    Comment: tubal and ablation  Other Topics Concern  . Not on file  Social History Narrative   Lives at home with children.   Right-handed.   Occasional caffeine use.   Social Determinants of Health   Financial Resource Strain:   . Difficulty of Paying Living Expenses:   Food Insecurity:   . Worried About Programme researcher, broadcasting/film/video in the Last Year:   . Barista in the Last Year:   Transportation Needs:   . Freight forwarder (Medical):   Marland Kitchen Lack of Transportation (Non-Medical):   Physical Activity:   . Days of Exercise per Week:   . Minutes of Exercise per Session:   Stress:   . Feeling of Stress :   Social Connections:   . Frequency of Communication with Friends and Family:   . Frequency of Social Gatherings with Friends and Family:   . Attends Religious Services:   . Active Member of Clubs or Organizations:   . Attends Banker Meetings:   Marland Kitchen Marital Status:   Intimate Partner Violence:   . Fear of Current or Ex-Partner:   . Emotionally Abused:   Marland Kitchen Physically Abused:   . Sexually Abused:      PHYSICAL EXAM   Vitals:   10/25/19 0846  BP: 117/76  Pulse: 65  Temp: 97.6 F (36.4 C)  Weight: 214 lb 8 oz (97.3 kg)  Height: 5\' 3"  (1.6 m)    Not recorded      Body mass index is 38 kg/m.  PHYSICAL EXAMNIATION:  Gen: NAD, conversant, well nourised, well groomed                     Cardiovascular: Regular rate rhythm, no peripheral edema, warm,  nontender. Eyes: Conjunctivae clear without exudates or hemorrhage Neck: Supple, no carotid bruits. Pulmonary: Clear to auscultation bilaterally   NEUROLOGICAL EXAM:  MENTAL STATUS: Speech:    Speech is normal; fluent and spontaneous with normal comprehension.  Cognition:     Orientation to time, place and person     Normal recent and remote memory     Normal Attention span and concentration     Normal Language, naming, repeating,spontaneous speech     Fund of knowledge   CRANIAL NERVES: CN II: Visual fields are full to  confrontation. Pupils are round equal and briskly reactive to light. CN III, IV, VI: extraocular movement are normal. No ptosis. CN V: Facial sensation is intact to light touch CN VII: Face is symmetric with normal eye closure  CN VIII: Hearing is normal to causal conversation. CN IX, X: Phonation is normal. CN XI: Head turning and shoulder shrug are intact  MOTOR: There is no pronator drift of out-stretched arms. Muscle bulk and tone are normal. Muscle strength is normal.  REFLEXES: Reflexes are 2+ and symmetric at the biceps, triceps, knees, and ankles. Plantar responses are flexor.  SENSORY: Mildly length dependent decreased to light touch, pinprick at distal foot, well-preserved bilateral toe vibratory and proprioception sensation  COORDINATION: There is no trunk or limb dysmetria noted.  GAIT/STANCE: Posture is normal. Gait is steady with normal steps, base, arm swing, and turning. Heel and toe walking are normal. Tandem gait is normal.  Romberg is absent.   DIAGNOSTIC DATA (LABS, IMAGING, TESTING) - I reviewed patient records, labs, notes, testing and imaging myself where available.   ASSESSMENT AND PLAN  CYERA BALBONI is a 39 y.o. female   Known history of Arnold-Chiari malformation,  Mild crowding of lower brainstem cerebellar region, no evidence of compression  New onset bilateral feet paresthesia with neuropathic pain  Differentiation  diagnosis include small fiber neuropathy, remote possibility of cervical syrinx  MRI of cervical spine  Laboratory evaluations for potential etiology  Continue gabapentin 100 mg every night as needed   Marcial Pacas, M.D. Ph.D.  [facility] [address], [place], [place] [address] Ph: [phone] Fax: [phone]  CC: Lavella Lemons, [place]

## 2019-10-27 ENCOUNTER — Telehealth: Payer: Self-pay | Admitting: Neurology

## 2019-10-27 NOTE — Telephone Encounter (Signed)
no to covid questions MR Cervical spine wo contrast Dr. Binnie Kand Auth: NPR. Patient is scheduled at [facility] for 11/09/19.

## 2019-10-28 LAB — VITAMIN B12: Vitamin B-12: 217 pg/mL — ABNORMAL LOW (ref 232–1245)

## 2019-10-28 LAB — MULTIPLE MYELOMA PANEL, SERUM
Albumin SerPl Elph-Mcnc: 3.6 g/dL (ref 2.9–4.4)
Albumin/Glob SerPl: 1.3 (ref 0.7–1.7)
Alpha 1: 0.2 g/dL (ref 0.0–0.4)
Alpha2 Glob SerPl Elph-Mcnc: 0.7 g/dL (ref 0.4–1.0)
B-Globulin SerPl Elph-Mcnc: 0.9 g/dL (ref 0.7–1.3)
Gamma Glob SerPl Elph-Mcnc: 1.1 g/dL (ref 0.4–1.8)
Globulin, Total: 2.9 g/dL (ref 2.2–3.9)
IgA/Immunoglobulin A, Serum: 223 mg/dL (ref 87–352)
IgG (Immunoglobin G), Serum: 1087 mg/dL (ref 586–1602)
IgM (Immunoglobulin M), Srm: 151 mg/dL (ref 26–217)
Total Protein: 6.5 g/dL (ref 6.0–8.5)

## 2019-10-28 LAB — SEDIMENTATION RATE: Sed Rate: 3 mm/hr (ref 0–32)

## 2019-10-28 LAB — COPPER, SERUM: Copper: 86 ug/dL (ref 80–158)

## 2019-10-28 LAB — B. BURGDORFI ANTIBODIES: Lyme IgG/IgM Ab: <0.91 {ISR} (ref 0.00–0.90)

## 2019-10-28 LAB — RPR: RPR Ser Ql: NONREACTIVE

## 2019-10-28 LAB — ANA W/REFLEX IF POSITIVE: Anti Nuclear Antibody (ANA): NEGATIVE

## 2019-10-28 LAB — SJOGRENS SYNDROME-B EXTRACTABLE NUCLEAR ANTIBODY: ENA SSB (LA) Ab: <0.2 AI (ref 0.0–0.9)

## 2019-10-28 LAB — C-REACTIVE PROTEIN: CRP: 1 mg/L (ref 0–10)

## 2019-10-28 LAB — HIV ANTIBODY (ROUTINE TESTING W REFLEX): HIV Screen 4th Generation wRfx: NONREACTIVE

## 2019-10-28 LAB — SJOGRENS SYNDROME-A EXTRACTABLE NUCLEAR ANTIBODY: ENA SSA (RO) Ab: <0.2 AI (ref 0.0–0.9)

## 2019-10-28 LAB — CK: Total CK: 41 U/L (ref 32–182)

## 2019-10-28 LAB — FERRITIN: Ferritin: 76 ng/mL (ref 15–150)

## 2019-10-31 ENCOUNTER — Telehealth: Payer: Self-pay | Admitting: Neurology

## 2019-10-31 NOTE — Telephone Encounter (Signed)
I called pt. I discussed her lab results and recommendations. Pt would prefer to have the B12 injections done at her PCP office since it is closer to her. She has had B12 injections in the past. She asked that I send a copy of these labs to Daria Pastures, PA's office. Pt verbalized understanding of results. Pt had no questions at this time but was encouraged to call back if questions arise.

## 2019-10-31 NOTE — Telephone Encounter (Signed)
Please call patient, laboratory evaluation showed B12 deficiency, level was 217, she would get benefit from IM B12 supplement, 1000 mcg daily for 1 week, then weekly for 1 month, then monthly  This can be done in her primary care's office, or our office  Rest of the laboratory evaluations were normal.

## 2019-11-09 ENCOUNTER — Other Ambulatory Visit: Payer: Self-pay

## 2019-11-09 ENCOUNTER — Ambulatory Visit: Payer: Commercial Managed Care - PPO

## 2019-11-09 DIAGNOSIS — Q0700 Arnold-Chiari syndrome without spina bifida or hydrocephalus: Secondary | ICD-10-CM | POA: Diagnosis not present

## 2019-11-09 DIAGNOSIS — R202 Paresthesia of skin: Secondary | ICD-10-CM

## 2019-11-10 ENCOUNTER — Telehealth: Payer: Self-pay | Admitting: *Deleted

## 2019-11-10 NOTE — Telephone Encounter (Signed)
-----   Message from Levert Feinstein, MD sent at 11/10/2019  4:38 PM EDT ----- Please call patient for no significant abnormality on MRI of the brain

## 2019-11-10 NOTE — Telephone Encounter (Signed)
I spoke to the patient and she verbalized understanding of her MRI results. 

## 2019-11-16 ENCOUNTER — Ambulatory Visit: Payer: Commercial Managed Care - PPO | Admitting: Neurology

## 2019-11-16 ENCOUNTER — Ambulatory Visit (INDEPENDENT_AMBULATORY_CARE_PROVIDER_SITE_OTHER): Payer: Commercial Managed Care - PPO | Admitting: Neurology

## 2019-11-16 ENCOUNTER — Other Ambulatory Visit: Payer: Self-pay

## 2019-11-16 DIAGNOSIS — R202 Paresthesia of skin: Secondary | ICD-10-CM

## 2019-11-16 DIAGNOSIS — R200 Anesthesia of skin: Secondary | ICD-10-CM | POA: Insufficient documentation

## 2019-11-16 DIAGNOSIS — Q0700 Arnold-Chiari syndrome without spina bifida or hydrocephalus: Secondary | ICD-10-CM | POA: Diagnosis not present

## 2019-11-16 MED ORDER — DULOXETINE HCL 30 MG PO CPEP: 30.0000 mg | ORAL_CAPSULE | Freq: Every day | ORAL | 11 refills | Status: DC

## 2019-11-16 NOTE — Procedures (Signed)
Full Name: Nina Terry Gender: Female MRN #: [id number] Date of Birth: 28-Feb-1981    Visit Date: 11/16/2019 07:25 Age: 39 Years Examining Physician: Marcial Pacas, MD  Referring Physician: Marcial Pacas, MD Height: 5 feet 3 inch History:   39 year old female complains of bilateral toes paresthesia  Summary of the tests:  Nerve conduction study:  Bilateral sural, superficial peroneal sensory responses were normal.  Bilateral tibial, peroneal to EDB motor responses were normal  Electromyography:  Selected needle examination of the right lower extremity muscles were normal.  Conclusion: This is a normal study.  There is no electrodiagnostic evidence of large fiber peripheral neuropathy.    ------------------------------- Marcial Pacas, M.D. PhD  [facility] [address], [place], [place] [address] Tel: [phone] Fax: [phone]  Verbal informed consent was obtained from the patient, patient was informed of potential risk of procedure, including bruising, bleeding, hematoma formation, infection, muscle weakness, muscle pain, numbness, among others.        [place]    Nerve / Sites Muscle Latency Ref. Amplitude Ref. Rel Amp Segments Distance Velocity Ref. Area    ms ms mV mV %  cm m/s m/s mVms  R Peroneal - EDB     Ankle EDB 4.4 ?6.5 5.1 ?2.0 100 Ankle - EDB 9   16.2     Fib head EDB 9.1  4.7  91.9 Fib head - Ankle 25 53 ?44 15.7     Pop fossa EDB 11.0  4.5  95.9 Pop fossa - Fib head 10 53 ?44 15.6         Pop fossa - Ankle      L Peroneal - EDB     Ankle EDB 3.7 ?6.5 4.7 ?2.0 100 Ankle - EDB 9   14.4     Fib head EDB 8.7  3.9  82.5 Fib head - Ankle 25 50 ?44 12.4     Pop fossa EDB 10.6  4.1  105 Pop fossa - Fib head 10 51 ?44 13.2         Pop fossa - Ankle      R Tibial - AH     Ankle AH 3.3 ?5.8 5.8 ?4.0 100 Ankle - AH 9   9.7     Pop fossa AH 11.5  5.0  86.4 Pop fossa - Ankle 36 44 ?41 8.2  L Tibial - AH     Ankle AH 3.7 ?5.8 8.2 ?4.0 100  Ankle - AH 9   15.3     Pop fossa AH 10.8  6.4  78 Pop fossa - Ankle 34 48 ?41 14.9             SNC    Nerve / Sites Rec. Site Peak Lat Ref.  Amp Ref. Segments Distance    ms ms V V  cm  R Sural - Ankle (Calf)     Calf Ankle 3.7 ?4.4 20 ?6 Calf - Ankle 14  L Sural - Ankle (Calf)     Calf Ankle 3.4 ?4.4 22 ?6 Calf - Ankle 14  R Superficial peroneal - Ankle     Lat leg Ankle 3.6 ?4.4 7 ?6 Lat leg - Ankle 14  L Superficial peroneal - Ankle     Lat leg Ankle 3.7 ?4.4 12 ?6 Lat leg - Ankle 14             F  Wave    Nerve F Lat Ref.  ms ms  R Tibial - AH 45.4 ?56.0  L Tibial - AH 46.1 ?56.0         EMG Summary Table    Spontaneous MUAP Recruitment  Muscle IA Fib PSW Fasc Other Amp Dur. Poly Pattern  R. Tibialis anterior Normal None None None _______ Normal Normal Normal Normal  R. Tibialis posterior Normal None None None _______ Normal Normal Normal Normal  R. Peroneus longus Normal None None None _______ Normal Normal Normal Normal  R. Gastrocnemius (Medial head) Normal None None None _______ Normal Normal Normal Normal  R. Vastus lateralis Normal None None None _______ Normal Normal Normal Normal

## 2019-11-16 NOTE — Progress Notes (Signed)
HISTORICAL  Nina Terry is a 39 year old female, seen in request by her primary care PA Lavella Lemons for evaluation of bilateral feet paresthesia, initial evaluation was on Oct 25, 2019.  I have reviewed and summarized the referring note from the referring physician.  I saw her previously in 2017 for reported episode of seeing spots in her visual field, which has leading to her MRI scan, showed findings consistent with Arnold-Chiari malformation, extensive laboratory evaluation at that time showed vitamin B12 deficiency, she received IM supplement, rest of the laboratory evaluation showed no significant abnormalities,, C-reactive protein, folic acid, HIV, RPR, ANA, CPK, Lyme titer, protein electrophoresis  She also complains of frequent passing out spells, there was witnessed episode by her friends who suffered epilepsy disorder herself, described patient as went limp, whole body shaking with mild postevent confusion, suggestive of seizure-like event, EEG at that time was normal, she was giving a trial of Topamax, she could not tolerate it even 100 mg every night, complains of feeling sluggish, has stopped the medication and lost follow-up  Since her ablation surgery for her heavy menstruation, her passing out spells has much improved,  Today she complains of 3 months history of bilateral feet paresthesia, started around February 2021, there was no clear triggers, involving distal foot, bilateral toes, plantar surface, symmetric, she described tingly burning stinging sensation, to the point of painful, especially after bearing weight, she also has the urge to move her leg after prolonged sitting, while trying to go to sleep, she was given gabapentin 100 mg every night, she take it intermittently, which was helpful  I personally reviewed MRI of the brain in September 2017, there was no acute intracranial abnormality, Arnold-Chiari malformation with cerebellar tonsillar extension 6 mm below the  foramen magnum, mild crowding of the brainstem and cerebellar tonsil, no evidence of compression.   Laboratory evaluations in March 2021, normal CMP, CBC, hemoglobin of 13.8, LDL of 84, TSH of 1.45, vitamin D was 13.6  UPDATE May 26th 2021: She return for electrodiagnostic study today, essentially normal, she continue complains of distal toes plantar and dorsal surface paresthesia, with prolonged weightbearing is can become painful, but he denied persistent sensory loss, denied gait abnormality, no bowel and bladder incontinence  Extensive laboratory evaluation showed vitamin B12 deficiency 217, 3 years ago was 55, she will start supplement through her primary care's office,  Rest of the laboratory evaluation showed normal or negative RPR, HIV, C-reactive protein, ferritin, ESR, CPK, ANA, copper level, SSA, SSB, protein electrophoresis, immunofixation protein electrophoresis, Lyme titer,  She is taking gabapentin as needed, which is helpful  I personally reviewed MRI of cervical spine, minimum cerebellar tonsillar ectopia 2 to 3 mm below foramen magnum, no compression,  REVIEW OF SYSTEMS: Full 14 system review of systems performed and notable only for as above All other review of systems were negative.  ALLERGIES: No Known Allergies  HOME MEDICATIONS: Current Outpatient Medications  Medication Sig Dispense Refill  . gabapentin (NEURONTIN) 100 MG capsule Take 100 mg by mouth at bedtime as needed.    Marland Kitchen VITAMIN D PO Take 2,000 Units by mouth daily.     No current facility-administered medications for this visit.    PAST MEDICAL HISTORY: Past Medical History:  Diagnosis Date  . Abnormal Pap smear   . Chlamydia   . Enlarged thyroid   . Enlarged thyroid   . History of Chiari malformation   . Hx of chlamydia infection   . Migraine   .  Peripheral neuropathy   . Trichomonas 10/18/2012  . Vaginal Pap smear, abnormal   . Vitamin D deficiency     PAST SURGICAL HISTORY: Past  Surgical History:  Procedure Laterality Date  . CESAREAN SECTION    . CESAREAN SECTION WITH BILATERAL TUBAL LIGATION  2006  . CHOLECYSTECTOMY    . CYST EXCISION Right    Armpit  . DENTAL SURGERY    . ENDOMETRIAL ABLATION      FAMILY HISTORY: Family History  Adopted: Yes  Problem Relation Age of Onset  . Hypertension Mother   . Thyroid disease Mother   . Cancer Sister        cervical   . Other Father        car accident  . Cancer Maternal Aunt        breast     SOCIAL HISTORY: Social History   Socioeconomic History  . Marital status: Single    Spouse name: Not on file  . Number of children: 2  . Years of education: Associates  . Highest education level: Not on file  Occupational History  . Occupation: Call Center  Tobacco Use  . Smoking status: Current Every Day Smoker    Packs/day: 0.50    Years: 17.00    Pack years: 8.50    Types: Cigarettes  . Smokeless tobacco: Never Used  Substance and Sexual Activity  . Alcohol use: Yes    Comment: occasional  . Drug use: No  . Sexual activity: Yes    Birth control/protection: Surgical    Comment: tubal and ablation  Other Topics Concern  . Not on file  Social History Narrative   Lives at home with children.   Right-handed.   Occasional caffeine use.   Social Determinants of Health   Financial Resource Strain:   . Difficulty of Paying Living Expenses:   Food Insecurity:   . Worried About Charity fundraiser in the Last Year:   . Arboriculturist in the Last Year:   Transportation Needs:   . Film/video editor (Medical):   Marland Kitchen Lack of Transportation (Non-Medical):   Physical Activity:   . Days of Exercise per Week:   . Minutes of Exercise per Session:   Stress:   . Feeling of Stress :   Social Connections:   . Frequency of Communication with Friends and Family:   . Frequency of Social Gatherings with Friends and Family:   . Attends Religious Services:   . Active Member of Clubs or Organizations:   .  Attends Archivist Meetings:   Marland Kitchen Marital Status:   Intimate Partner Violence:   . Fear of Current or Ex-Partner:   . Emotionally Abused:   Marland Kitchen Physically Abused:   . Sexually Abused:      PHYSICAL EXAM   There were no vitals filed for this visit.  Not recorded      There is no height or weight on file to calculate BMI.  PHYSICAL EXAMNIATION:  Gen: NAD, conversant, well nourised, well groomed  NEUROLOGICAL EXAM:  MENTAL STATUS: Speech:    Speech is normal; fluent and spontaneous with normal comprehension.  Cognition:     Orientation to time, place and person     Normal recent and remote memory     Normal Attention span and concentration     Normal Language, naming, repeating,spontaneous speech     Fund of knowledge   CRANIAL NERVES: CN II: Visual fields are full to confrontation.  Pupils are round equal and briskly reactive to light. CN III, IV, VI: extraocular movement are normal. No ptosis. CN V: Facial sensation is intact to light touch CN VII: Face is symmetric with normal eye closure  CN VIII: Hearing is normal to causal conversation. CN IX, X: Phonation is normal. CN XI: Head turning and shoulder shrug are intact  MOTOR: Muscle bulk and tone are normal. Muscle strength is normal.  REFLEXES: Reflexes are 2+ and symmetric at the biceps, triceps, knees, and ankles. Plantar responses are flexor.  SENSORY: Mildly length dependent decreased to light touch, pinprick at distal foot, well-preserved bilateral toe vibratory and proprioception sensation  COORDINATION: There is no trunk or limb dysmetria noted.  GAIT/STANCE: Posture is normal. Gait is steady with normal steps,   DIAGNOSTIC DATA (LABS, IMAGING, TESTING) - I reviewed patient records, labs, notes, testing and imaging myself where available.   ASSESSMENT AND PLAN  KAITELYN JAMISON is a 39 y.o. female   Known history of Arnold-Chiari malformation,  Mild crowding of lower brainstem cerebellar  region, no evidence of compression  New onset bilateral feet paresthesia with neuropathic pain  Differentiation diagnosis include small fiber neuropathy, remote possibility of cervical syrinx  MRI of cervical spine showed minimum cerebellar tonsil ectopia 2 to 3 mm below foramen magnum, no evidence of compression  Laboratory evaluation showed vitamin B12 deficiency, she will continue B12 supplement through her primary care physician  Cymbalta 30 mg for symptom control   Marcial Pacas, M.D. Ph.D.  [facility] [address], [place], [place] [address] Ph: [phone] Fax: [phone]  CC: Lavella Lemons, [place]

## 2019-11-25 ENCOUNTER — Encounter: Payer: Self-pay | Admitting: Obstetrics & Gynecology

## 2019-11-25 ENCOUNTER — Ambulatory Visit (INDEPENDENT_AMBULATORY_CARE_PROVIDER_SITE_OTHER): Payer: Commercial Managed Care - PPO | Admitting: Obstetrics & Gynecology

## 2019-11-25 VITALS — BP 127/79 | HR 69 | Ht 63.0 in | Wt 218.5 lb

## 2019-11-25 DIAGNOSIS — N8311 Corpus luteum cyst of right ovary: Secondary | ICD-10-CM

## 2019-11-25 DIAGNOSIS — K5790 Diverticulosis of intestine, part unspecified, without perforation or abscess without bleeding: Secondary | ICD-10-CM

## 2019-11-25 MED ORDER — MEGESTROL ACETATE 40 MG PO TABS: 40.0000 mg | ORAL_TABLET | Freq: Every day | ORAL | 1 refills | Status: DC

## 2019-11-25 NOTE — Progress Notes (Signed)
Follow up appointment for results  Chief Complaint  Patient presents with   Follow-up    went to [facility] with ovarian cyst    Blood pressure 127/79, pulse 69, height 5\' 3"  (1.6 m), weight 218 lb 8 oz (99.1 kg).   Imaging Results - documented in this encounter  CT Abdomen Pelvis W Contrast (11/24/2019 11:13 AM EDT) CT Abdomen Pelvis W Contrast (11/24/2019 11:13 AM EDT)  Specimen     CT Abdomen Pelvis W Contrast (11/24/2019 11:13 AM EDT)  Impressions Performed At  1. Normal appendix  2. 3.5 cm left ovarian cyst most likely a corpus luteum cyst with  small amount of free fluid.  3. Colonic diverticulosis without diverticulitis      Electronically Signed  By: 01/24/2020 M.D.  On: 11/24/2019 11:40    [facility] RAD    CT Abdomen Pelvis W Contrast (11/24/2019 11:13 AM EDT)  Narrative Performed At  CLINICAL DATA: Right lower quadrant pain. Rule out appendicitis.    EXAM:  CT ABDOMEN AND PELVIS WITH CONTRAST    TECHNIQUE:  Multidetector CT imaging of the abdomen and pelvis was performed  using the standard protocol following bolus administration of  intravenous contrast.    CONTRAST: 125 mL Isovue 370 IV    COMPARISON: CT abdomen pelvis 06/03/2018    FINDINGS:  Lower chest: Lung bases clear bilaterally.    Hepatobiliary: No focal liver abnormality is seen. Status post  cholecystectomy. No biliary dilatation.    Pancreas: Negative    Spleen: Negative    Adrenals/Urinary Tract: Adrenal glands are unremarkable. Kidneys are  normal, without renal calculi, focal lesion, or hydronephrosis.  Bladder is unremarkable.    Stomach/Bowel: Stomach is within normal limits. Appendix appears  normal. No evidence of bowel wall thickening, distention, or  inflammatory changes. Scattered small diverticula throughout the  colon. Negative for diverticulitis.    Vascular/Lymphatic: No significant vascular findings are  present. No  enlarged abdominal or pelvic lymph nodes.    Reproductive: Normal uterus. 3.5 cm rim enhancing cyst posterior to  the uterus on the left. This is likely a corpus luteum cyst of the  ovary. Right ovary is normal. Minimal free fluid in the pelvis.    Other: Negative for hernia.    Musculoskeletal: Negative    [facility] RAD    CT Abdomen Pelvis W Contrast (11/24/2019 11:13 AM EDT)  Procedure Note  Interface, Rad Results In - 11/24/2019 11:43 AM EDT  Formatting of this note might be different from the original. CLINICAL DATA: Right lower quadrant pain. Rule out appendicitis.  EXAM: CT ABDOMEN AND PELVIS WITH CONTRAST  TECHNIQUE: Multidetector CT imaging of the abdomen and pelvis was performed using the standard protocol following bolus administration of intravenous contrast.  CONTRAST: 125 mL Isovue 370 IV  COMPARISON: CT abdomen pelvis 06/03/2018  FINDINGS: Lower chest: Lung bases clear bilaterally.  Hepatobiliary: No focal liver abnormality is seen. Status post cholecystectomy. No biliary dilatation.  Pancreas: Negative  Spleen: Negative  Adrenals/Urinary Tract: Adrenal glands are unremarkable. Kidneys are normal, without renal calculi, focal lesion, or hydronephrosis. Bladder is unremarkable.  Stomach/Bowel: Stomach is within normal limits. Appendix appears normal. No evidence of bowel wall thickening, distention, or inflammatory changes. Scattered small diverticula throughout the colon. Negative for diverticulitis.  Vascular/Lymphatic: No significant vascular findings are present. No enlarged abdominal or pelvic lymph nodes.  Reproductive: Normal uterus. 3.5 cm rim enhancing cyst posterior to the uterus on the left. This is likely a corpus luteum cyst of  the ovary. Right ovary is normal. Minimal free fluid in the pelvis.  Other: Negative for hernia.  Musculoskeletal: Negative  IMPRESSION: 1. Normal appendix 2. 3.5 cm left ovarian cyst  most likely a corpus luteum cyst with small amount of free fluid. 3. Colonic diverticulosis without diverticulitis   Electronically Signed By: Franchot Gallo M.D. On: 11/24/2019 11:40        MEDS ordered this encounter: Meds ordered this encounter  Medications   megestrol (MEGACE) 40 MG tablet    Sig: Take 1 tablet (40 mg total) by mouth daily.    Dispense:  30 tablet    Refill:  1    Orders for this encounter: No orders of the defined types were placed in this encounter.   Impression:   ICD-10-CM   1. Corpus luteum cyst of right ovary  N83.11    megestrol for suppression of stimulation of the cyst  2. Diverticulosis  K57.90    Web MD diet referral for patient     Plan: See above  Follow Up: No follow-ups on file.       Face to face time:  15 minutes  Greater than 50% of the visit time was spent in counseling and coordination of care with the patient.  The summary and outline of the counseling and care coordination is summarized in the note above.   All questions were answered.  Past Medical History:  Diagnosis Date   Abnormal Pap smear    Chlamydia    Enlarged thyroid    Enlarged thyroid    History of Chiari malformation    Hx of chlamydia infection    Migraine    Peripheral neuropathy    Trichomonas 10/18/2012   Vaginal Pap smear, abnormal    Vitamin D deficiency     Past Surgical History:  Procedure Laterality Date   CESAREAN SECTION     CESAREAN SECTION WITH BILATERAL TUBAL LIGATION  2006   CHOLECYSTECTOMY     CYST EXCISION Right    Armpit   DENTAL SURGERY     ENDOMETRIAL ABLATION      OB History    Gravida  2   Para  2   Term      Preterm      AB      Living  2     SAB      TAB      Ectopic      Multiple      Live Births  2           No Known Allergies  Social History   Socioeconomic History   Marital status: Single    Spouse name: Not on file   Number of children: 2   Years of  education: Associates   Highest education level: Not on file  Occupational History   Occupation: [employer] Center  Tobacco Use   Smoking status: Current Every Day Smoker    Packs/day: 0.50    Years: 17.00    Pack years: 8.50    Types: Cigarettes   Smokeless tobacco: Never Used  Substance and Sexual Activity   Alcohol use: Yes    Comment: occasional   Drug use: No   Sexual activity: Yes    Birth control/protection: Surgical    Comment: tubal and ablation  Other Topics Concern   Not on file  Social History Narrative   Lives at home with children.   Right-handed.   Occasional caffeine use.   Social Determinants of  Health   Financial Resource Strain:    Difficulty of Paying Living Expenses:   Food Insecurity:    Worried About Programme researcher, broadcasting/film/video in the Last Year:    Barista in the Last Year:   Transportation Needs:    Freight forwarder (Medical):    Lack of Transportation (Non-Medical):   Physical Activity:    Days of Exercise per Week:    Minutes of Exercise per Session:   Stress:    Feeling of Stress :   Social Connections:    Frequency of Communication with Friends and Family:    Frequency of Social Gatherings with Friends and Family:    Attends Religious Services:    Active Member of Clubs or Organizations:    Attends Engineer, structural:    Marital Status:     Family History  Adopted: Yes  Problem Relation Age of Onset   Hypertension Mother    Thyroid disease Mother    Cancer Sister        cervical    Other Father        car accident   Cancer Maternal Aunt        breast

## 2019-12-09 ENCOUNTER — Other Ambulatory Visit: Payer: Self-pay | Admitting: Obstetrics & Gynecology

## 2020-01-05 ENCOUNTER — Other Ambulatory Visit: Payer: Commercial Managed Care - PPO | Admitting: Adult Health

## 2021-03-08 ENCOUNTER — Encounter: Payer: Self-pay | Admitting: Adult Health

## 2021-03-08 ENCOUNTER — Ambulatory Visit (INDEPENDENT_AMBULATORY_CARE_PROVIDER_SITE_OTHER): Payer: 59 | Admitting: Adult Health

## 2021-03-08 ENCOUNTER — Other Ambulatory Visit (HOSPITAL_COMMUNITY)
Admission: RE | Admit: 2021-03-08 | Discharge: 2021-03-08 | Disposition: A | Discharge status: Home / Self Care | Payer: 59 | Source: Ambulatory Visit | Attending: Adult Health | Admitting: Adult Health

## 2021-03-08 ENCOUNTER — Other Ambulatory Visit: Payer: Self-pay

## 2021-03-08 VITALS — BP 122/81 | HR 67 | Ht 63.5 in | Wt 190.5 lb

## 2021-03-08 DIAGNOSIS — Z01419 Encounter for gynecological examination (general) (routine) without abnormal findings: Secondary | ICD-10-CM | POA: Diagnosis not present

## 2021-03-08 NOTE — Progress Notes (Signed)
Patient ID: Nina Terry, female   DOB: [birth year], 40 y.o.   MRN: [id number] History of Present Illness: Nina Terry is a 40 year old female,single, G2P2, in for well woman gyn exam and pap.  PCP is [facility].   Current Medications, Allergies, Past Medical History, Past Surgical History, Family History and Social History were reviewed in [employer] record.     Review of Systems: Patient denies any headaches, hearing loss, fatigue, blurred vision, shortness of breath, chest pain, abdominal pain, problems with bowel movements, urination, or intercourse. No joint pain or mood swings.  She did have cyst on ovary and diverticulosis last year   Physical Exam:BP 122/81 (BP Location: Left Arm, Patient Position: Sitting, Cuff Size: Normal)   Pulse 67   Ht 5' 3.5" (1.613 m)   Wt 190 lb 8 oz (86.4 kg)   LMP 03/01/2021 (Approximate)   BMI 33.22 kg/m   General:  Well developed, well nourished, no acute distress Skin:  Warm and dry Neck:  Midline trachea, normal thyroid, good ROM, no lymphadenopathy Lungs; Clear to auscultation bilaterally Breast:  No dominant palpable mass, retraction, or nipple discharge Cardiovascular: Regular rate and rhythm Abdomen:  Soft, non tender, no hepatosplenomegaly Pelvic:  External genitalia is normal in appearance, no lesions.  The vagina is normal in appearance. Urethra has no lesions or masses. The cervix is bulbous.Pap with GC/CHL and HR HPV genotyping performed.  Uterus is felt to be normal size, shape, and contour.  No adnexal masses or tenderness noted.Bladder is non tender, no masses felt. Rectal: Deferred Extremities/musculoskeletal:  No swelling or varicosities noted, no clubbing or cyanosis Psych:  No mood changes, alert and cooperative,seems happy AA is 1 Fall risk is low Depression screen [facility] 2/9 03/08/2021 05/26/2018 12/10/2017  Decreased Interest 0 1 1  Down, Depressed, Hopeless 0 1 1  PHQ - 2 Score 0 2 2  Altered sleeping 0 1 1   Tired, decreased energy 1 1 1   Change in appetite 0 1 1  Feeling bad or failure about yourself  0 1 1  Trouble concentrating 0 2 1  Moving slowly or fidgety/restless 0 1 1  Suicidal thoughts 0 1 0  PHQ-9 Score 1 10 8   Difficult doing work/chores - Somewhat difficult Somewhat difficult    GAD 7 : Generalized Anxiety Score 03/08/2021  Nervous, Anxious, on Edge 1  Control/stop worrying 1  Worry too much - different things 1  Trouble relaxing 0  Restless 0  Easily annoyed or irritable 0  Afraid - awful might happen 0  Total GAD 7 Score 3   .  Upstream - 03/08/21 1158       Pregnancy Intention Screening   Does the patient want to become pregnant in the next year? No    Does the patient's partner want to become pregnant in the next year? No    Would the patient like to discuss contraceptive options today? No      Contraception Wrap Up   Current Method Female Sterilization    End Method Female Sterilization    Contraception Counseling Provided No            Examination chaperoned by 03/10/2021 LPN  Impression and Plan: 1. Encounter for gynecological examination with Papanicolaou smear of cervix Pap sent Physical in 1 year  Pap in 3 if normal Check CBC,CMP,TSH and lipids  Mammogram at 41

## 2021-03-09 LAB — LIPID PANEL
Chol/HDL Ratio: 4.5 ratio — ABNORMAL HIGH (ref 0.0–4.4)
Cholesterol, Total: 145 mg/dL (ref 100–199)
HDL: 32 mg/dL — ABNORMAL LOW (ref >39)
LDL Chol Calc (NIH): 93 mg/dL (ref 0–99)
Triglycerides: 110 mg/dL (ref 0–149)
VLDL Cholesterol Cal: 20 mg/dL (ref 5–40)

## 2021-03-09 LAB — COMPREHENSIVE METABOLIC PANEL
ALT: 10 IU/L (ref 0–32)
AST: 18 IU/L (ref 0–40)
Albumin/Globulin Ratio: 1.8 (ref 1.2–2.2)
Albumin: 4.3 g/dL (ref 3.8–4.8)
Alkaline Phosphatase: 57 IU/L (ref 44–121)
BUN/Creatinine Ratio: 11 (ref 9–23)
BUN: 8 mg/dL (ref 6–20)
Bilirubin Total: 0.8 mg/dL (ref 0.0–1.2)
CO2: 20 mmol/L (ref 20–29)
Calcium: 8.8 mg/dL (ref 8.7–10.2)
Chloride: 104 mmol/L (ref 96–106)
Creatinine, Ser: 0.74 mg/dL (ref 0.57–1.00)
Globulin, Total: 2.4 g/dL (ref 1.5–4.5)
Glucose: 74 mg/dL (ref 65–99)
Potassium: 4.6 mmol/L (ref 3.5–5.2)
Sodium: 139 mmol/L (ref 134–144)
Total Protein: 6.7 g/dL (ref 6.0–8.5)
eGFR: 105 mL/min/{1.73_m2} (ref >59)

## 2021-03-09 LAB — CBC
Hematocrit: 42.5 % (ref 34.0–46.6)
Hemoglobin: 13.9 g/dL (ref 11.1–15.9)
MCH: 31.1 pg (ref 26.6–33.0)
MCHC: 32.7 g/dL (ref 31.5–35.7)
MCV: 95 fL (ref 79–97)
Platelets: 181 10*3/uL (ref 150–450)
RBC: 4.47 x10E6/uL (ref 3.77–5.28)
RDW: 12.3 % (ref 11.7–15.4)
WBC: 10.1 10*3/uL (ref 3.4–10.8)

## 2021-03-09 LAB — TSH: TSH: 0.616 u[IU]/mL (ref 0.450–4.500)

## 2021-03-12 ENCOUNTER — Telehealth: Payer: Self-pay

## 2021-03-12 NOTE — Telephone Encounter (Signed)
Returned pt's call. Two identifiers used. Relayed test results. Pt confirmed understanding. Still waiting for pap smear results.

## 2021-03-12 NOTE — Telephone Encounter (Signed)
Called pt to relay test results per Cyril Mourning, no answer, left vm to call back.

## 2021-03-14 LAB — CYTOLOGY - PAP
Chlamydia: NEGATIVE
Comment: NEGATIVE
Comment: NEGATIVE
Comment: NEGATIVE
Comment: NORMAL
HPV 16: NEGATIVE
HPV 18 / 45: NEGATIVE
High risk HPV: POSITIVE — AB
Neisseria Gonorrhea: NEGATIVE

## 2021-03-18 ENCOUNTER — Encounter: Payer: Self-pay | Admitting: Adult Health

## 2021-03-18 ENCOUNTER — Telehealth: Payer: Self-pay | Admitting: Adult Health

## 2021-03-18 DIAGNOSIS — R87612 Low grade squamous intraepithelial lesion on cytologic smear of cervix (LGSIL): Secondary | ICD-10-CM

## 2021-03-18 DIAGNOSIS — R87619 Unspecified abnormal cytological findings in specimens from cervix uteri: Secondary | ICD-10-CM | POA: Insufficient documentation

## 2021-03-18 HISTORY — DX: Low grade squamous intraepithelial lesion on cytologic smear of cervix (LGSIL): R87.612

## 2021-03-18 NOTE — Telephone Encounter (Signed)
Pt aware that pap +HPV and LSIL will need colpo, appt made for 04/05/21 with Joellyn Haff

## 2021-04-05 ENCOUNTER — Other Ambulatory Visit (HOSPITAL_COMMUNITY)
Admission: RE | Admit: 2021-04-05 | Discharge: 2021-04-05 | Disposition: A | Discharge status: Home / Self Care | Payer: 59 | Source: Ambulatory Visit | Attending: Women's Health | Admitting: Women's Health

## 2021-04-05 ENCOUNTER — Ambulatory Visit (INDEPENDENT_AMBULATORY_CARE_PROVIDER_SITE_OTHER): Payer: 59 | Admitting: Women's Health

## 2021-04-05 ENCOUNTER — Other Ambulatory Visit: Payer: Self-pay

## 2021-04-05 ENCOUNTER — Encounter: Payer: Self-pay | Admitting: Women's Health

## 2021-04-05 VITALS — BP 129/87 | HR 59 | Ht 63.0 in | Wt 191.5 lb

## 2021-04-05 DIAGNOSIS — R87612 Low grade squamous intraepithelial lesion on cytologic smear of cervix (LGSIL): Secondary | ICD-10-CM

## 2021-04-05 DIAGNOSIS — Z3202 Encounter for pregnancy test, result negative: Secondary | ICD-10-CM | POA: Diagnosis not present

## 2021-04-05 LAB — POCT URINE PREGNANCY: Preg Test, Ur: NEGATIVE

## 2021-04-05 NOTE — Addendum Note (Signed)
Addended by: Colen Darling on: 04/05/2021 01:35 PM   Modules accepted: Orders

## 2021-04-05 NOTE — Progress Notes (Signed)
   COLPOSCOPY PROCEDURE NOTE Patient name: Nina Terry MRN [id number]  Date of birth: 02-Nov-1980 Subjective Findings:   CHAREE TUMBLIN is a 40 y.o. G2P2 Caucasian female being seen today for a colposcopy. Indication: Abnormal pap on 03/08/21: LSIL w/ HRHPV positive: other (not 16, 18/45)  Prior cytology:  Date Result Procedure  2019 NILM w/ HRHPV positive: type not specified None  2016 NILM w/ HRHPV positive: type not specified None  2015 NILM w/ HRHPV positive: type not specified None  2012 LSIL w/ HRHPV not done colpo  No LMP recorded. Patient has had an ablation. Contraception: tubal ligation. Menopausal: no. Hysterectomy: no.   Smoker: yes. Immunocompromised: no.   The risks and benefits were explained and informed consent was obtained, and written copy is in chart. Pertinent History Reviewed:   Reviewed past medical,surgical, social, obstetrical and family history.  Reviewed problem list, medications and allergies. Objective Findings & Procedure:   Vitals:   04/05/21 1233  BP: 129/87  Pulse: (!) 59  Weight: 191 lb 8 oz (86.9 kg)  Height: 5\' 3"  (1.6 m)  Body mass index is 33.92 kg/m.  No results found for this or any previous visit (from the past 24 hour(s)).   Time out was performed.  Speculum placed in the vagina, cervix fully visualized. SCJ: not fully visualized. Cervix swabbed x 3 with acetic acid.  Acetowhitening present: Yes Cervix: no visible lesions, no mosaicism, no punctation, no abnormal vasculature, and light acetowhite lesion(s) noted at 3 o'clock. Endocervical curettage performed, Cervical biopsies taken at 3 & 10 o'clock, and Hemostasis achieved with Monsel's solution. Vagina: vaginal colposcopy not performed Vulva: vulvar colposcopy not performed  Specimens: 3  Complications: none  Chaperone:    Colposcopic Impression & Plan:   Colposcopy findings consistent with LSIL Plan: Post biopsy instructions given, Will notify patient of results when  back, and Will base plan of care on pathology results and ASCCP guidelines  Return in about 1 year (around 04/05/2022) for Pap & physical.  04/07/2022 CNM, WHNP-BC 04/05/2021 1:04 PM

## 2021-04-05 NOTE — Patient Instructions (Signed)
Colposcopy, Care After This sheet gives you information about how to care for yourself after your procedure. Your health care provider may also give you more specific instructions. If you have problems or questions, contact your health care provider. What can I expect after the procedure? If you had a colposcopy without a biopsy, you can expect to feel fine right away after your procedure. However, you may have some spotting of blood for a few days. You can return to your normal activities. If you had a colposcopy with a biopsy, it is common after the procedure to have: Soreness and mild pain. These may last for a few days. Light-headedness. Mild vaginal bleeding or discharge that is dark-colored and grainy. This may last for a few days. The discharge may be caused by a liquid (solution) that was used during the procedure. You may need to wear a sanitary pad during this time. Spotting of blood for at least 48 hours after the procedure. Follow these instructions at home: Medicines Take over-the-counter and prescription medicines only as told by your health care provider. Talk with your health care provider about what type of over-the-counter pain medicine and prescription medicine you can start to take again. It is especially important to talk with your health care provider if you take blood thinners. Activity Limit your physical activity for the first day after your procedure as told by your health care provider. Avoid using douche products, using tampons, or having sex for at least 3 days after the procedure or for as long as told. Return to your normal activities as told by your health care provider. Ask your health care provider what activities are safe for you. General instructions  Drink enough fluid to keep your urine pale yellow. Ask your health care provider if you may take baths, swim, or use a hot tub. You may take showers. If you use birth control (contraception), continue to use  it. Keep all follow-up visits as told by your health care provider. This is important. Contact a health care provider if: You develop a skin rash. Get help right away if: You bleed a lot from your vagina or pass blood clots. This includes using more than one sanitary pad each hour for 2 hours in a row. You have a fever or chills. You have vaginal discharge that is abnormal, is yellow in color, or smells bad. This could be a sign of infection. You have severe pain or cramps in your lower abdomen that do not go away with medicine. You faint. Summary If you had a colposcopy without a biopsy, you can expect to feel fine right away, but you may have some spotting of blood for a few days. You can return to your normal activities. If you had a colposcopy with a biopsy, it is common to have mild pain for a few days and spotting for 48 hours after the procedure. Avoid using douche products, using tampons, and having sex for at least 3 days after the procedure or for as long as told by your health care provider. Get help right away if you have heavy bleeding, severe pain, or signs of infection. This information is not intended to replace advice given to you by your health care provider. Make sure you discuss any questions you have with your health care provider. Document Revised: 06/08/2019 Document Reviewed: 06/08/2019 Elsevier Patient Education  2022 Elsevier Inc.  

## 2021-04-09 LAB — SURGICAL PATHOLOGY

## 2021-07-17 ENCOUNTER — Encounter (HOSPITAL_COMMUNITY): Payer: Self-pay | Admitting: Emergency Medicine

## 2021-07-17 ENCOUNTER — Emergency Department (HOSPITAL_COMMUNITY)
Admission: EM | Admit: 2021-07-17 | Discharge: 2021-07-17 | Disposition: A | Discharge status: Home / Self Care | Payer: 59 | Attending: Student | Admitting: Student

## 2021-07-17 ENCOUNTER — Emergency Department (HOSPITAL_COMMUNITY): Payer: 59

## 2021-07-17 DIAGNOSIS — R739 Hyperglycemia, unspecified: Secondary | ICD-10-CM | POA: Insufficient documentation

## 2021-07-17 DIAGNOSIS — Z20822 Contact with and (suspected) exposure to covid-19: Secondary | ICD-10-CM | POA: Insufficient documentation

## 2021-07-17 DIAGNOSIS — K029 Dental caries, unspecified: Secondary | ICD-10-CM | POA: Diagnosis not present

## 2021-07-17 DIAGNOSIS — R112 Nausea with vomiting, unspecified: Secondary | ICD-10-CM | POA: Insufficient documentation

## 2021-07-17 DIAGNOSIS — J069 Acute upper respiratory infection, unspecified: Secondary | ICD-10-CM | POA: Diagnosis not present

## 2021-07-17 DIAGNOSIS — R197 Diarrhea, unspecified: Secondary | ICD-10-CM | POA: Insufficient documentation

## 2021-07-17 DIAGNOSIS — R0602 Shortness of breath: Secondary | ICD-10-CM | POA: Diagnosis not present

## 2021-07-17 DIAGNOSIS — R1013 Epigastric pain: Secondary | ICD-10-CM | POA: Insufficient documentation

## 2021-07-17 DIAGNOSIS — R059 Cough, unspecified: Secondary | ICD-10-CM | POA: Diagnosis present

## 2021-07-17 LAB — COMPREHENSIVE METABOLIC PANEL
ALT: 23 U/L (ref 0–44)
AST: 27 U/L (ref 15–41)
Albumin: 3.7 g/dL (ref 3.5–5.0)
Alkaline Phosphatase: 49 U/L (ref 38–126)
Anion gap: 3 — ABNORMAL LOW (ref 5–15)
BUN: 6 mg/dL (ref 6–20)
CO2: 24 mmol/L (ref 22–32)
Calcium: 8.4 mg/dL — ABNORMAL LOW (ref 8.9–10.3)
Chloride: 108 mmol/L (ref 98–111)
Creatinine, Ser: 0.74 mg/dL (ref 0.44–1.00)
GFR, Estimated: >60 mL/min (ref >60)
Glucose, Bld: 102 mg/dL — ABNORMAL HIGH (ref 70–99)
Potassium: 4.1 mmol/L (ref 3.5–5.1)
Sodium: 135 mmol/L (ref 135–145)
Total Bilirubin: 0.7 mg/dL (ref 0.3–1.2)
Total Protein: 6.5 g/dL (ref 6.5–8.1)

## 2021-07-17 LAB — CBC
HCT: 41.6 % (ref 36.0–46.0)
Hemoglobin: 13.5 g/dL (ref 12.0–15.0)
MCH: 30.8 pg (ref 26.0–34.0)
MCHC: 32.5 g/dL (ref 30.0–36.0)
MCV: 95 fL (ref 80.0–100.0)
Platelets: 169 10*3/uL (ref 150–400)
RBC: 4.38 MIL/uL (ref 3.87–5.11)
RDW: 12.3 % (ref 11.5–15.5)
WBC: 8.7 10*3/uL (ref 4.0–10.5)
nRBC: 0 % (ref 0.0–0.2)

## 2021-07-17 LAB — LIPASE, BLOOD: Lipase: 30 U/L (ref 11–51)

## 2021-07-17 LAB — TROPONIN I (HIGH SENSITIVITY)
Troponin I (High Sensitivity): <2 ng/L (ref <18)
Troponin I (High Sensitivity): <2 ng/L (ref <18)

## 2021-07-17 LAB — URINALYSIS, ROUTINE W REFLEX MICROSCOPIC
Bilirubin Urine: NEGATIVE
Glucose, UA: NEGATIVE mg/dL
Hgb urine dipstick: NEGATIVE
Ketones, ur: NEGATIVE mg/dL
Leukocytes,Ua: NEGATIVE
Nitrite: NEGATIVE
Protein, ur: NEGATIVE mg/dL
Specific Gravity, Urine: 1.012 (ref 1.005–1.030)
pH: 8 (ref 5.0–8.0)

## 2021-07-17 LAB — RESP PANEL BY RT-PCR (FLU A&B, COVID) ARPGX2
Influenza A by PCR: NEGATIVE
Influenza B by PCR: NEGATIVE
SARS Coronavirus 2 by RT PCR: NEGATIVE

## 2021-07-17 LAB — I-STAT BETA HCG BLOOD, ED (MC, WL, AP ONLY): I-stat hCG, quantitative: <5 m[IU]/mL (ref <5)

## 2021-07-17 NOTE — Discharge Instructions (Signed)
I suspect you have viral infection that will likely start to improve after the next few days.  Please make sure you are drinking plenty of fluids. You can treat your symptoms supportively with tylenol 650 mg/1000mg  and ibuprofen 600 mg every 6 hours for fevers and pains. For nasal congestion you can use Zyrtec and Flonase to help with nasal congestion. To treat cough you can use over the counter cough medications such as Mucinex DM or Robitussin and throat lozenges.  If you have persistent diarrhea you can use over-the-counter Imodium.  If your symptoms are not improving please follow up with you Primary doctor.   If you develop persistent fevers, shortness of breath or difficulty breathing, chest pain, severe headache and neck pain, persistent nausea and vomiting or other new or concerning symptoms return to the Emergency department.

## 2021-07-17 NOTE — ED Provider Triage Note (Signed)
Emergency Medicine Provider Triage Evaluation Note  JANEYA DEYO , a 41 y.o. female  was evaluated in triage.  Pt complains of nausea vomiting body aches, diarrhea, chest pain, shortness of breath.  T-max of 101 at home, negative home COVID test.  Onset Monday  Review of Systems  Positive: Vomiting Negative: Active abdominal pain  Physical Exam  BP (!) 149/95 (BP Location: Right Arm)    Pulse 68    Temp 99.3 F (37.4 C)    Resp 18    SpO2 97%  Gen:   Awake, no distress   Resp:  Normal effort  MSK:   Moves extremities without difficulty  Other:  No abdominal tenderness  Medical Decision Making  Medically screening exam initiated at 11:14 AM.  Appropriate orders placed.  KATIANNE BARRE was informed that the remainder of the evaluation will be completed by another provider, this initial triage assessment does not replace that evaluation, and the importance of remaining in the ED until their evaluation is complete.  Work-up initiated   Arthor Captain, PA-C 07/17/21 1115

## 2021-07-17 NOTE — ED Notes (Signed)
Patient verbalizes understanding of discharge instructions. Opportunity for questioning and answers were provided. Armband removed by staff, pt discharged from ED ambulatory. PA provided pt with work note.

## 2021-07-17 NOTE — ED Provider Notes (Signed)
[place] EMERGENCY DEPARTMENT Provider Note   CSN: [id number] Arrival date & time: 07/17/21  1001     History  Chief Complaint  Patient presents with   Shortness of Breath   Chest Pain   unable to taste   Cough   Sore Throat    Nina Terry is a 41 y.o. female with past medical history significant for Chiari malformation, enlarged thyroid, who presents with 1 episode of nausea, vomiting, abdominal pain, chest pain without shortness of breath since Monday at around 3 AM.  Patient reports that she woke up on Monday with a fever, vomiting, abdominal pain, has had intermittent chest pain that feels like pressure since then.  Patient denies history of ACS, diabetes, hypertension, hyperlipidemia.  Patient reports that she is adopted does not know whether she has family history of ACS.  No personal history of stroke.  Patient does report that she has had some diarrhea although she has not had any blood in her stool.  Patient denies dysuria, urgency, frequency.  Chest pain not associated with exertion, does not radiate, not associated with diaphoresis.  Patient reports that she had also had some sore throat that is since resolved, She does endorse some decreased taste, smell.   Shortness of Breath Associated symptoms: chest pain   Chest Pain Associated symptoms: shortness of breath   Cough Associated symptoms: chest pain and shortness of breath   Sore Throat Associated symptoms include chest pain and shortness of breath.      Home Medications Prior to Admission medications   Not on File      Allergies    Patient has no known allergies.    Review of Systems   Review of Systems  Respiratory:  Positive for shortness of breath.   Cardiovascular:  Positive for chest pain.  All other systems reviewed and are negative.  Physical Exam Updated Vital Signs BP 132/78 (BP Location: Left Arm)    Pulse 70    Temp 98 F (36.7 C) (Oral)    Resp 17    SpO2 98%   Physical Exam Vitals and nursing note reviewed.  Constitutional:      General: She is not in acute distress.    Appearance: Normal appearance.  HENT:     Head: Normocephalic and atraumatic.     Mouth/Throat:     Comments: Posterior oropharynx clear, no tonsillar exudate noted.  No peritonsillar abscess noted.  Uvula midline.  Tonsils 1+ bilaterally.  Some dental caries noted on the left. Eyes:     General:        Right eye: No discharge.        Left eye: No discharge.  Cardiovascular:     Rate and Rhythm: Normal rate and regular rhythm.     Heart sounds: No murmur heard.   No friction rub. No gallop.  Pulmonary:     Effort: Pulmonary effort is normal.     Breath sounds: Normal breath sounds.  Abdominal:     General: Bowel sounds are normal.     Palpations: Abdomen is soft.     Comments: Very minimal epigastric tenderness to palpation. No rebound, rigidity, guarding.  Normal bowel sounds throughout.  Musculoskeletal:     Cervical back: Neck supple. No rigidity.  Lymphadenopathy:     Cervical: No cervical adenopathy.  Skin:    General: Skin is warm and dry.     Capillary Refill: Capillary refill takes less than 2 seconds.  Neurological:  Mental Status: She is alert and oriented to person, place, and time.  Psychiatric:        Mood and Affect: Mood normal.        Behavior: Behavior normal.    ED Results / Procedures / Treatments   Labs (all labs ordered are listed, but only abnormal results are displayed) Labs Reviewed  COMPREHENSIVE METABOLIC PANEL - Abnormal; Notable for the following components:      Result Value   Glucose, Bld 102 (*)    Calcium 8.4 (*)    Anion gap 3 (*)    All other components within normal limits  URINALYSIS, ROUTINE W REFLEX MICROSCOPIC - Abnormal; Notable for the following components:   APPearance HAZY (*)    All other components within normal limits  RESP PANEL BY RT-PCR (FLU A&B, COVID) ARPGX2  CULTURE, GROUP A STREP ([facility])  CBC   LIPASE, BLOOD  I-STAT BETA HCG BLOOD, ED ([facility], [facility], [facility] ONLY)  TROPONIN I (HIGH SENSITIVITY)  TROPONIN I (HIGH SENSITIVITY)    EKG None  Radiology DG Chest 2 View  Result Date: 07/17/2021 CLINICAL DATA:  Chest pain EXAM: CHEST - 2 VIEW COMPARISON:  None. FINDINGS: Heart size and mediastinal contours are within normal limits. No suspicious pulmonary opacities identified. No pleural effusion or pneumothorax visualized. No acute osseous abnormality appreciated. IMPRESSION: No acute intrathoracic process identified. Electronically Signed   By: Ofilia Neas M.D.   On: 07/17/2021 11:35    Procedures Procedures    Medications Ordered in ED Medications - No data to display  ED Course/ Medical Decision Making/ A&P                           Medical Decision Making Amount and/or Complexity of Data Reviewed Labs: ordered. Radiology: ordered.   Given the large differential diagnosis for Nina Terry, the decision making in this case is of high complexity.  After evaluating all of the data points in this case, the presentation of Nina Terry is NOT consistent with Acute Coronary Syndrome (ACS) and/or myocardial ischemia, pulmonary embolism, aortic dissection; Borhaave's, significant arrythmia, pneumothorax, cardiac tamponade, or other emergent cardiopulmonary condition.  Further, the presentation of Nina Terry is NOT consistent with pericarditis, myocarditis, cholecystitis, pancreatitis, mediastinitis, endocarditis, new valvular disease.  Additionally, the presentation of Nina Terry is NOT consistent with flail chest, cardiac contusion, ARDS, or significant intra-thoracic or intra-abdominal bleeding.  Moreover, this presentation is NOT consistent with pneumonia, sepsis, or pyelonephritis.  The patient has a History that is consistent with an upper respiratory infection versus viral gastroenteritis infection.  She has negative troponin x1 with last episode of chest pain greater  than 6 hours since arrival.  She has a reassuring chest x-ray with no signs of pneumonia, or other intrathoracic abnormality.  She has a heart score of 2 with a story that is not consistent with cardiac chest pain.  I personally reviewed her lab work which is significant for unremarkable CBC, CMP, very minimal hyperglycemia of 102.  Negative RVP, normal lipase, not pregnant.  Reassuring urinalysis.  Initial troponin was less than 2.  She is discharged with delta troponin, strep in process.  Clinically doubt strep infection as patient has no fever, no tonsillar exudate, no cervical lymphadenopathy.  Interpreted radiographic imaging and independently reviewed chest x-ray which showed no intrathoracic abnormality.  I agree with radiologist or potation.  EKG shows underlying rhythm of sinus.  My attending Dr. Matilde Sprang  independently reviewed and agrees with these findings.  Based on isolated episode of nausea vomiting, ongoing dull epigastric pain, some malaise, congestion, loss of taste, and transient sore throat clinically suspect viral upper respiratory infection versus viral gastroenteritis.  Encourage supportive care.  Patient discharged in stable condition, encouraged to follow-up with her primary care provider.  Strict return and follow-up precautions have been given by me personally or by detailed written instruction given verbally by nursing staff using the teach back method to the patient/family/caregiver(s).  Data Reviewed/Counseling: I have reviewed the patient's vital signs, nursing notes, and other relevant tests/information. I had a detailed discussion regarding the historical points, exam findings, and any diagnostic results supporting the discharge diagnosis. I also discussed the need for outpatient follow-up and the need to return to the ED if symptoms worsen or if there are any questions or concerns that arise at home.  Final Clinical Impression(s) / ED Diagnoses Final diagnoses:  Viral upper  respiratory tract infection    Rx / DC Orders ED Discharge Orders     None         Dorien Chihuahua 07/17/21 1847    Teressa Lower, MD 07/17/21 2321

## 2021-07-17 NOTE — ED Triage Notes (Signed)
Pt. Stated, I woke up Monday morning around 0300 with SOB, chest pain, coughing, and a sore throat. I did throw up one time that Monday morning but not any more.

## 2021-07-19 LAB — CULTURE, GROUP A STREP (THRC)

## 2022-10-25 ENCOUNTER — Emergency Department (HOSPITAL_COMMUNITY)
Admission: EM | Admit: 2022-10-25 | Discharge: 2022-10-25 | Disposition: A | Discharge status: Home / Self Care | Payer: 59 | Attending: Emergency Medicine | Admitting: Emergency Medicine

## 2022-10-25 ENCOUNTER — Emergency Department (HOSPITAL_COMMUNITY): Payer: 59

## 2022-10-25 ENCOUNTER — Encounter (HOSPITAL_COMMUNITY): Payer: Self-pay

## 2022-10-25 ENCOUNTER — Other Ambulatory Visit: Payer: Self-pay

## 2022-10-25 DIAGNOSIS — R519 Headache, unspecified: Secondary | ICD-10-CM | POA: Diagnosis present

## 2022-10-25 LAB — MAGNESIUM: Magnesium: 2.1 mg/dL (ref 1.7–2.4)

## 2022-10-25 LAB — BASIC METABOLIC PANEL
Anion gap: 9 (ref 5–15)
BUN: 8 mg/dL (ref 6–20)
CO2: 23 mmol/L (ref 22–32)
Calcium: 8.9 mg/dL (ref 8.9–10.3)
Chloride: 104 mmol/L (ref 98–111)
Creatinine, Ser: 0.64 mg/dL (ref 0.44–1.00)
GFR, Estimated: >60 mL/min (ref >60)
Glucose, Bld: 108 mg/dL — ABNORMAL HIGH (ref 70–99)
Potassium: 3.8 mmol/L (ref 3.5–5.1)
Sodium: 136 mmol/L (ref 135–145)

## 2022-10-25 LAB — CBC
HCT: 48 % — ABNORMAL HIGH (ref 36.0–46.0)
Hemoglobin: 16 g/dL — ABNORMAL HIGH (ref 12.0–15.0)
MCH: 31.6 pg (ref 26.0–34.0)
MCHC: 33.3 g/dL (ref 30.0–36.0)
MCV: 94.9 fL (ref 80.0–100.0)
Platelets: 185 10*3/uL (ref 150–400)
RBC: 5.06 MIL/uL (ref 3.87–5.11)
RDW: 12.4 % (ref 11.5–15.5)
WBC: 10.6 10*3/uL — ABNORMAL HIGH (ref 4.0–10.5)
nRBC: 0 % (ref 0.0–0.2)

## 2022-10-25 LAB — CK: Total CK: 41 U/L (ref 38–234)

## 2022-10-25 MED ORDER — KETOROLAC TROMETHAMINE 30 MG/ML IJ SOLN
30.0000 mg | Freq: Once | INTRAMUSCULAR | Status: AC
  Administered 2022-10-25: 30 mg via INTRAVENOUS
  Filled 2022-10-25: qty 1

## 2022-10-25 MED ORDER — DIPHENHYDRAMINE HCL 50 MG/ML IJ SOLN
25.0000 mg | Freq: Once | INTRAMUSCULAR | Status: AC
  Administered 2022-10-25: 25 mg via INTRAVENOUS
  Filled 2022-10-25: qty 1

## 2022-10-25 MED ORDER — SODIUM CHLORIDE 0.9 % IV BOLUS
1000.0000 mL | Freq: Once | INTRAVENOUS | Status: AC
  Administered 2022-10-25: 1000 mL via INTRAVENOUS

## 2022-10-25 MED ORDER — ONDANSETRON HCL 4 MG/2ML IJ SOLN
4.0000 mg | Freq: Once | INTRAMUSCULAR | Status: AC | PRN
  Administered 2022-10-25: 4 mg via INTRAVENOUS
  Filled 2022-10-25: qty 2

## 2022-10-25 MED ORDER — METOCLOPRAMIDE HCL 5 MG/ML IJ SOLN
10.0000 mg | Freq: Once | INTRAMUSCULAR | Status: AC
  Administered 2022-10-25: 10 mg via INTRAVENOUS
  Filled 2022-10-25: qty 2

## 2022-10-25 NOTE — Discharge Instructions (Signed)
Drink plenty of water for the next few days.  Please call give her neurology to arrange a follow-up appointment.  Return emergency department for any new or worsening symptoms.

## 2022-10-25 NOTE — ED Provider Notes (Signed)
[place] EMERGENCY DEPARTMENT AT [facility] Provider Note   CSN: [id number] Arrival date & time: 10/25/22  1422     History {Add pertinent medical, surgical, social history, OB history to HPI:1} No chief complaint on file.   Nina Terry is a 42 y.o. female.  HPI     Nina Terry is a 42 y.o. female with past medical history of Arnold-Chiari malformation who presents to the Emergency Department complaining of nausea, vomiting, headache and neck pain.  Symptoms present for 2 days.  States she woke up with vomiting this morning.  States that she has been exposed to excessive amounts of heat as her car does not have air conditioning and she drives 1 hour to and from work and sits in her car 1 hour each day for lunch.  Endorses heavy sweating for the last 2 days and having dark colored urine.  She was seen at another ER facility in March for headaches and a syncopal episode.  Diagnosed with Chiari malformation years ago, has not followed up with neurology since her diagnosis.  Endorses syncopal episodes in the past secondary to her diagnosis.  Denies any history of ACS or arrhythmias.  Also denies chest pain, shortness of breath, diarrhea, and pain with urination    Home Medications Prior to Admission medications   Not on File      Allergies    Patient has no known allergies.    Review of Systems   Review of Systems  Constitutional:  Negative for appetite change, chills and fever.  HENT:  Negative for congestion.   Eyes:  Negative for visual disturbance.  Respiratory:  Negative for shortness of breath.   Cardiovascular:  Negative for chest pain and palpitations.  Gastrointestinal:  Positive for nausea and vomiting. Negative for abdominal pain and diarrhea.  Genitourinary:        Dark colored urine  Musculoskeletal:  Positive for neck pain. Negative for back pain and neck stiffness.  Skin:  Negative for color change and wound.  Neurological:  Positive for  light-headedness and headaches. Negative for dizziness, syncope, speech difficulty, weakness and numbness.       Near syncope  Psychiatric/Behavioral:  Negative for confusion.     Physical Exam Updated Vital Signs BP (!) 148/90 (BP Location: Right Arm)   Pulse (!) 57   Temp 98.9 F (37.2 C) (Oral)   Resp 18   Ht 5\' 3"  (1.6 m)   Wt 83.9 kg   LMP 10/20/2022   SpO2 100%   BMI 32.77 kg/m  Physical Exam Vitals and nursing note reviewed.  Constitutional:      General: She is not in acute distress.    Appearance: Normal appearance. She is not toxic-appearing.  HENT:     Head: Normocephalic.     Right Ear: Tympanic membrane and ear canal normal.     Left Ear: Tympanic membrane and ear canal normal.     Mouth/Throat:     Mouth: Mucous membranes are moist.  Eyes:     Extraocular Movements: Extraocular movements intact.     Conjunctiva/sclera: Conjunctivae normal.     Pupils: Pupils are equal, round, and reactive to light.  Neck:     Trachea: Phonation normal.     Meningeal: Kernig's sign absent.  Cardiovascular:     Rate and Rhythm: Normal rate and regular rhythm.     Pulses: Normal pulses.  Pulmonary:     Effort: Pulmonary effort is normal.  Abdominal:  General: There is no distension.     Palpations: Abdomen is soft.     Tenderness: There is no abdominal tenderness.  Musculoskeletal:        General: No swelling.     Cervical back: Tenderness present. No rigidity. Muscular tenderness present.     Right lower leg: No edema.     Left lower leg: No edema.  Skin:    General: Skin is warm.     Capillary Refill: Capillary refill takes less than 2 seconds.  Neurological:     General: No focal deficit present.     Mental Status: She is alert.     Sensory: No sensory deficit.     Motor: No weakness.     ED Results / Procedures / Treatments   Labs (all labs ordered are listed, but only abnormal results are displayed) Labs Reviewed  BASIC METABOLIC PANEL - Abnormal;  Notable for the following components:      Result Value   Glucose, Bld 108 (*)    All other components within normal limits  CBC - Abnormal; Notable for the following components:   WBC 10.6 (*)    Hemoglobin 16.0 (*)    HCT 48.0 (*)    All other components within normal limits  CK  MAGNESIUM  URINALYSIS, ROUTINE W REFLEX MICROSCOPIC  CBG MONITORING, ED  POC URINE PREG, ED    EKG EKG Interpretation  Date/Time:  Saturday Oct 25 2022 14:45:56 EDT Ventricular Rate:  60 PR Interval:  118 QRS Duration: 104 QT Interval:  444 QTC Calculation: 444 R Axis:   70 Text Interpretation: Normal sinus rhythm with sinus arrhythmia Normal ECG Confirmed by Cathren Laine ([id number]) on 10/25/2022 2:50:19 PM  Radiology CT Head Wo Contrast  Result Date: 10/25/2022 CLINICAL DATA:  Syncope/presyncope, cerebrovascular cause suspected headache, hx of Chari Malformation. EXAM: CT HEAD WITHOUT CONTRAST TECHNIQUE: Contiguous axial images were obtained from the base of the skull through the vertex without intravenous contrast. RADIATION DOSE REDUCTION: This exam was performed according to the departmental dose-optimization program which includes automated exposure control, adjustment of the mA and/or kV according to patient size and/or use of iterative reconstruction technique. COMPARISON:  Head CT 09/05/2022. FINDINGS: Brain: No acute intracranial hemorrhage. Gray-white differentiation is preserved. No hydrocephalus or extra-axial collection. No mass effect or midline shift. Mild tonsillar ectopia with effacement of the prepontine cistern and decreased mamillopontine distance, suspicious for intracranial hypotension. Vascular: No hyperdense vessel or unexpected calcification. Skull: No calvarial fracture or suspicious bone lesion. Skull base is unremarkable. Sinuses/Orbits: Unremarkable. Other: None. IMPRESSION: 1. No acute intracranial abnormality. 2. Findings suspicious for intracranial hypotension. Consider MRI of the  brain with and without contrast for further evaluation. Electronically Signed   By: Orvan Falconer M.D.   On: 10/25/2022 16:30    Procedures Procedures  {Document cardiac monitor, telemetry assessment procedure when appropriate:1}  Medications Ordered in ED Medications  ondansetron (ZOFRAN) injection 4 mg (4 mg Intravenous Given 10/25/22 1514)  sodium chloride 0.9 % bolus 1,000 mL (1,000 mLs Intravenous New Bag/Given 10/25/22 1558)  ketorolac (TORADOL) 30 MG/ML injection 30 mg (30 mg Intravenous Given 10/25/22 1558)  diphenhydrAMINE (BENADRYL) injection 25 mg (25 mg Intravenous Given 10/25/22 1559)  metoCLOPramide (REGLAN) injection 10 mg (10 mg Intravenous Given 10/25/22 1558)    ED Course/ Medical Decision Making/ A&P   {   Click here for ABCD2, HEART and other calculatorsREFRESH Note before signing :1}  Medical Decision Making Patient with history of Chiari malformation here with diffuse headache, gradual onset describes near syncopal episode earlier today also having nausea and vomiting.  Endorses heat exposure for the last 2 days and dark-colored urine.  Differential would include but not limited to rhabdomyolysis, dehydration, worsening of her Chiari malformation, cardiac arrhythmia, viral process, meningitis, subarachnoid hemorrhage  Amount and/or Complexity of Data Reviewed Labs: ordered. Radiology: ordered.  Risk Prescription drug management.     {Document critical care time when appropriate:1} {Document review of labs and clinical decision tools ie heart score, Chads2Vasc2 etc:1}  {Document your independent review of radiology images, and any outside records:1} {Document your discussion with family members, caretakers, and with consultants:1} {Document social determinants of health affecting pt's care:1} {Document your decision making why or why not admission, treatments were needed:1} Final Clinical Impression(s) / ED Diagnoses Final diagnoses:   None    Rx / DC Orders ED Discharge Orders     None

## 2022-10-25 NOTE — ED Notes (Signed)
Patient transported to CT 

## 2022-10-25 NOTE — ED Triage Notes (Signed)
Pt c/o neck pain, HA, N/V  with near syncope after vomiting when she woke up this AM. Pt states her AC is out in her car and she has been sweating heavily in the heat for the past few days. Pt reports dark urine.

## 2023-08-17 IMAGING — DX DG CHEST 2V
2 series · 2 of 2 positions shown · non-contrast
Comparison: None.

CLINICAL DATA: Chest pain

EXAM:
CHEST - 2 VIEW

[w chest pa]
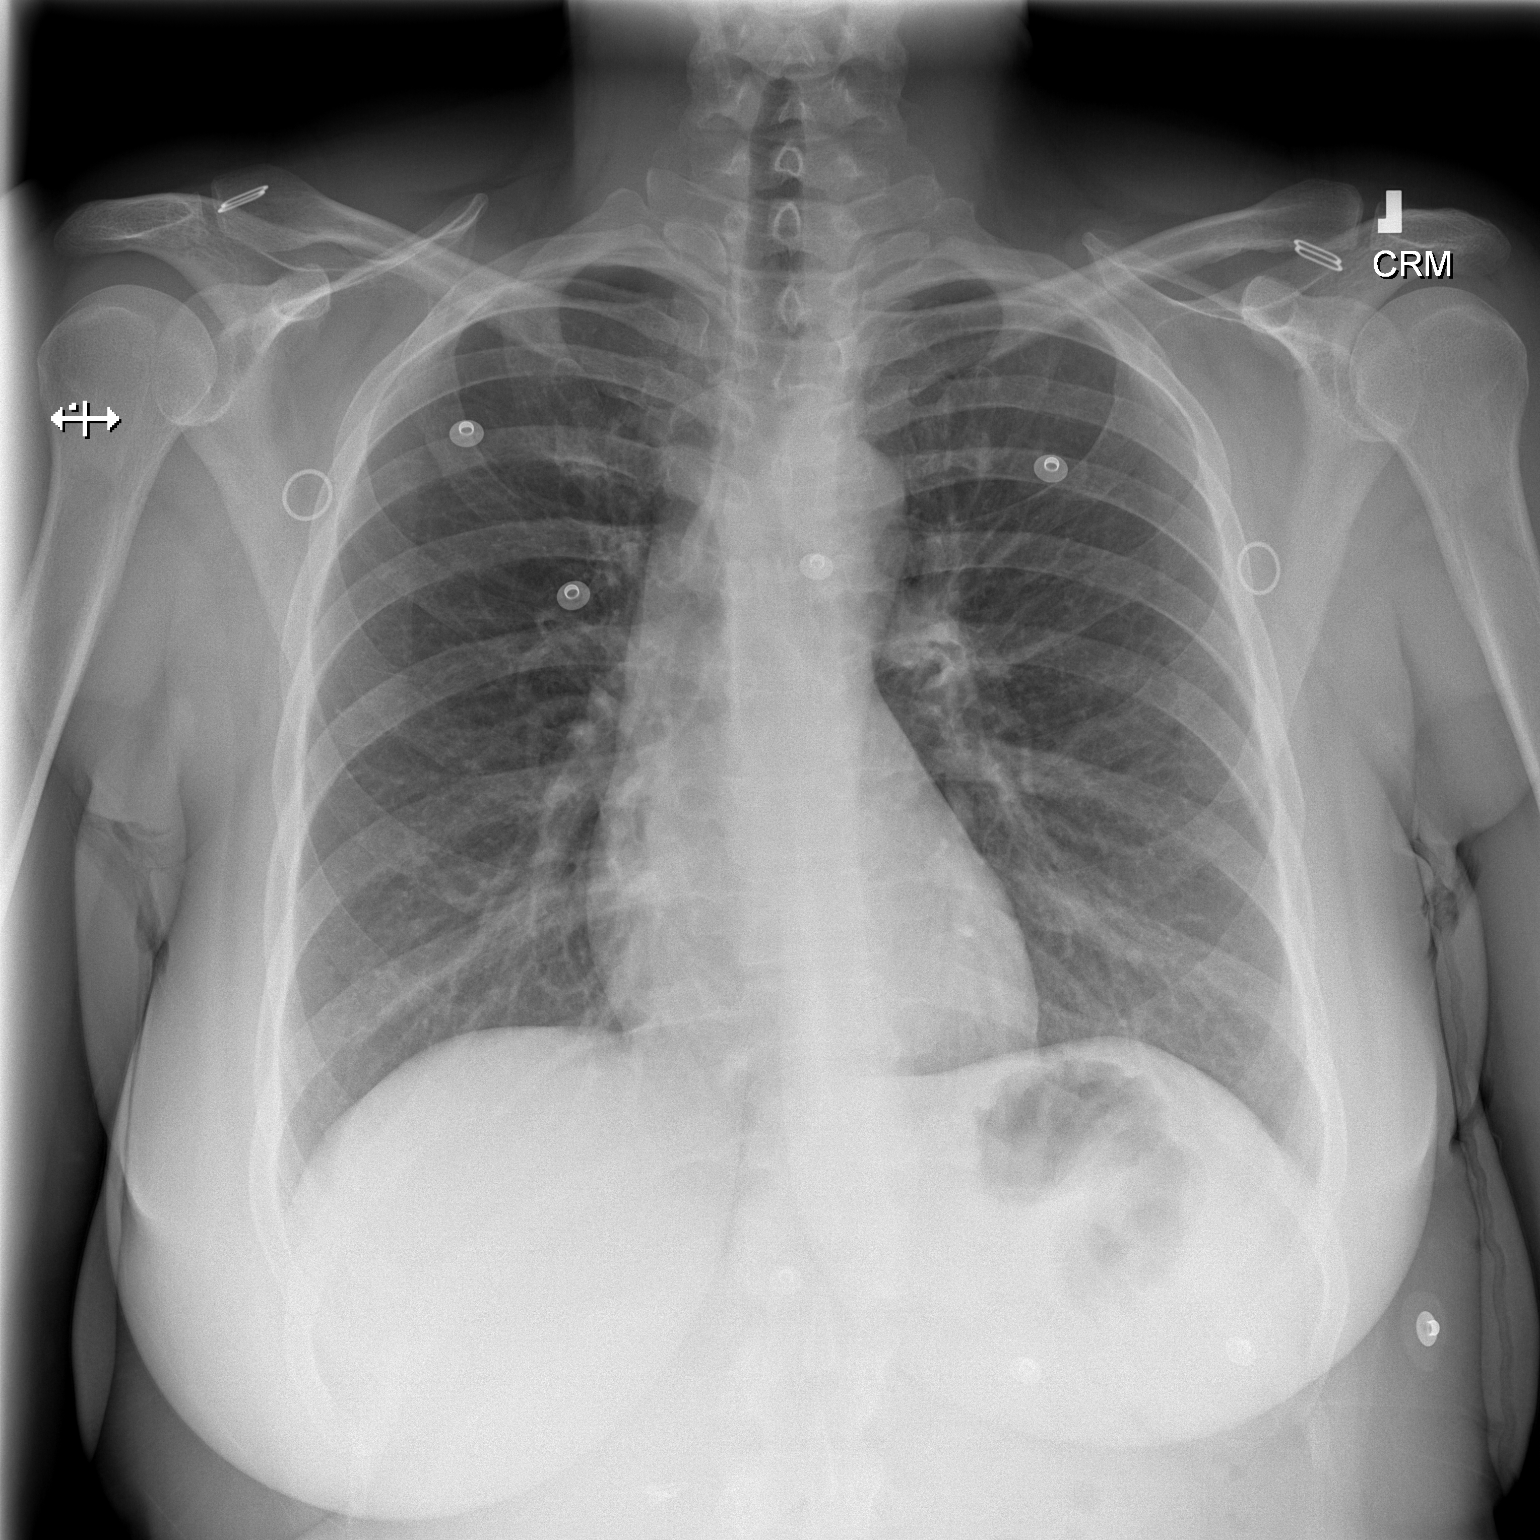

[w chest lat]
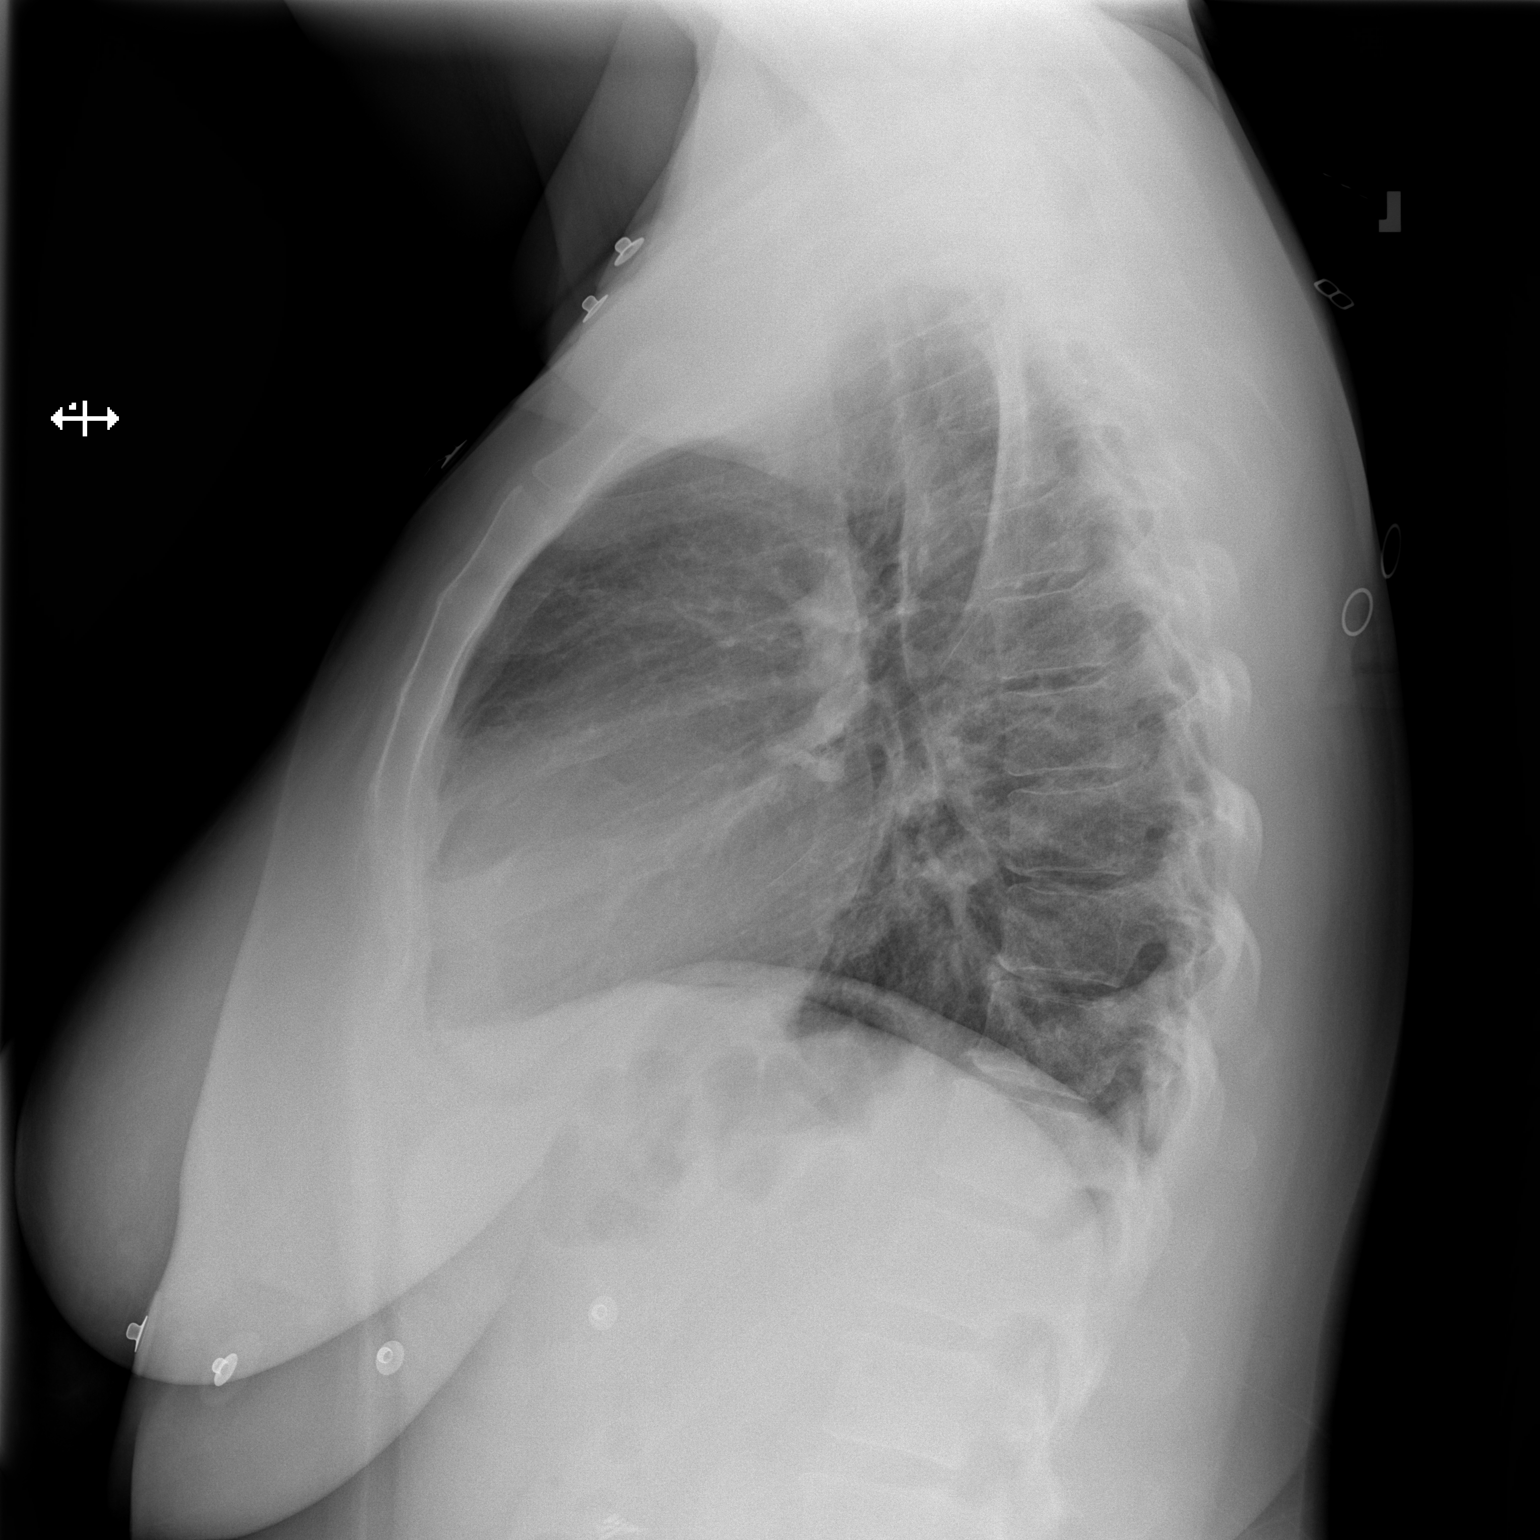

[2 of 2 positions shown; findings below may reference images not displayed]

FINDINGS: Heart size and mediastinal contours are within normal limits. No
suspicious pulmonary opacities identified.

No pleural effusion or pneumothorax visualized.

No acute osseous abnormality appreciated.
IMPRESSION: No acute intrathoracic process identified.

## 2024-04-26 ENCOUNTER — Other Ambulatory Visit (HOSPITAL_COMMUNITY)
Admission: RE | Admit: 2024-04-26 | Discharge: 2024-04-26 | Disposition: A | Discharge status: Home / Self Care | Source: Ambulatory Visit | Attending: Adult Health | Admitting: Adult Health

## 2024-04-26 ENCOUNTER — Ambulatory Visit (INDEPENDENT_AMBULATORY_CARE_PROVIDER_SITE_OTHER): Admitting: Adult Health

## 2024-04-26 ENCOUNTER — Encounter: Payer: Self-pay | Admitting: Adult Health

## 2024-04-26 VITALS — BP 132/84 | HR 84 | Ht 63.0 in | Wt 176.0 lb

## 2024-04-26 DIAGNOSIS — Z1151 Encounter for screening for human papillomavirus (HPV): Secondary | ICD-10-CM | POA: Diagnosis not present

## 2024-04-26 DIAGNOSIS — Z01419 Encounter for gynecological examination (general) (routine) without abnormal findings: Secondary | ICD-10-CM

## 2024-04-26 DIAGNOSIS — F32A Depression, unspecified: Secondary | ICD-10-CM

## 2024-04-26 DIAGNOSIS — Z8742 Personal history of other diseases of the female genital tract: Secondary | ICD-10-CM

## 2024-04-26 DIAGNOSIS — F419 Anxiety disorder, unspecified: Secondary | ICD-10-CM | POA: Diagnosis not present

## 2024-04-26 DIAGNOSIS — G8929 Other chronic pain: Secondary | ICD-10-CM | POA: Insufficient documentation

## 2024-04-26 DIAGNOSIS — F439 Reaction to severe stress, unspecified: Secondary | ICD-10-CM | POA: Diagnosis not present

## 2024-04-26 DIAGNOSIS — R1031 Right lower quadrant pain: Secondary | ICD-10-CM

## 2024-04-26 NOTE — Progress Notes (Signed)
 Patient ID: Nina Terry, female   DOB: 01-24-1981, 43 y.o.   MRN: [id number] History of Present Illness: Nina Terry is a 43 year old white female, with SO, G2P2 in for a well woman gyn exam and pap. Her last pap was LSIL, +HPV 03/08/21 and she had a colpo 04/05/21 CIN 1, has not been seen since. She has had RLQ pain on and off for several years now. She had an ablation years ago and has started back having a period in last 3 years. She has stress at home with SO. Has lost 50 lbs in last 3 years.   PCP is Elsie Brought PA   Current Medications, Allergies, Past Medical History, Past Surgical History, Family History and Social History were reviewed in [employer] record.     Review of Systems: Skin warm and dry. Neck: mid line trachea, normal thyroid , good ROM, no lymphadenopathy noted. Lungs: clear to ausculation bilaterally. Cardiovascular: regular rate and rhythm.  See HPI for positives    Physical Exam:BP 132/84 (BP Location: Right Arm, Patient Position: Sitting, Cuff Size: Normal)   Pulse 84   Ht 5' 3 (1.6 m)   Wt 176 lb (79.8 kg)   LMP 04/11/2024 (Approximate)   BMI 31.18 kg/m   General:  Well developed, well nourished, no acute distress Skin:  Warm and dry Neck:  Midline trachea, normal thyroid , good ROM, no lymphadenopathy Lungs; Clear to auscultation bilaterally Breast:  No dominant palpable mass, retraction, or nipple discharge Cardiovascular: Regular rate and rhythm Abdomen:  Soft, non tender, no hepatosplenomegaly Pelvic:  External genitalia is normal in appearance, no lesions.  The vagina is normal in appearance. Urethra has no lesions or masses. The cervix is bulbous. Pap with HR HPV genotyping and GC/CHL performed. Uterus is felt to be normal size, shape, and contour.  No adnexal masses or tenderness noted.Bladder is non tender, no masses felt. Rectal: Deferred  Extremities/musculoskeletal:  No swelling or varicosities noted, no clubbing or  cyanosis Psych:  No mood changes, alert and cooperative,seems happy AA is 0 Fall risk is low    04/26/2024    9:40 AM 03/08/2021   11:56 AM 05/26/2018    3:15 PM  Depression screen PHQ 2/9  Decreased Interest 1 0 1  Down, Depressed, Hopeless 1 0 1  PHQ - 2 Score 2 0 2  Altered sleeping 1 0 1  Tired, decreased energy 1 1 1   Change in appetite 1 0 1  Feeling bad or failure about yourself  1 0 1  Trouble concentrating 1 0 2  Moving slowly or fidgety/restless 0 0 1  Suicidal thoughts 0 0 1  PHQ-9 Score 7 1 10   Difficult doing work/chores   Somewhat difficult       04/26/2024    9:41 AM 03/08/2021   11:56 AM  GAD 7 : Generalized Anxiety Score  Nervous, Anxious, on Edge 1 1  Control/stop worrying 1 1  Worry too much - different things 1 1  Trouble relaxing 1 0  Restless 1 0  Easily annoyed or irritable 1 0  Afraid - awful might happen 1 0  Total GAD 7 Score 7 3      Upstream - 04/26/24 0945       Pregnancy Intention Screening   Does the patient want to become pregnant in the next year? No    Does the patient's partner want to become pregnant in the next year? No    Would the patient like  to discuss contraceptive options today? No      Contraception Wrap Up   Current Method Female Sterilization    End Method Female Sterilization    Contraception Counseling Provided No         Examination chaperoned by Clarita Salt LPN   Impression and plan: 1. Encounter for gynecological examination with Papanicolaou smear of cervix (Primary) Pap sent Physical in 1 year Labs with PCP Had negative mammogram in August at [facility] per pt - Cytology - PAP( [place])  2. Stress at home  3. Chronic RLQ pain +RLQ  pain and off for several years Will get pelvic US  in office to assess uterus and ovaries and talk when results back  - US  PELVIC COMPLETE WITH TRANSVAGINAL; Future  4. Anxiety and depression Declines meds   5. History of abnormal cervical Pap smear Pap sent

## 2024-04-28 LAB — CYTOLOGY - PAP
Chlamydia: NEGATIVE
Comment: NEGATIVE
Comment: NEGATIVE
Comment: NEGATIVE
Comment: NEGATIVE
Comment: NORMAL
HPV 16: NEGATIVE
HPV 18 / 45: NEGATIVE
High risk HPV: POSITIVE — AB
Neisseria Gonorrhea: NEGATIVE

## 2024-05-02 ENCOUNTER — Ambulatory Visit: Payer: Self-pay | Admitting: Adult Health

## 2024-05-02 DIAGNOSIS — R87612 Low grade squamous intraepithelial lesion on cytologic smear of cervix (LGSIL): Secondary | ICD-10-CM | POA: Insufficient documentation

## 2024-05-02 MED ORDER — METRONIDAZOLE 500 MG PO TABS: 500.0000 mg | ORAL_TABLET | Freq: Two times a day (BID) | ORAL | 0 refills | Status: AC

## 2024-05-04 ENCOUNTER — Telehealth: Payer: Self-pay

## 2024-05-04 NOTE — Telephone Encounter (Signed)
 Left patient message to call office to get colpo scheduled.

## 2024-05-09 ENCOUNTER — Telehealth: Payer: Self-pay | Admitting: *Deleted

## 2024-05-09 NOTE — Telephone Encounter (Signed)
 Pt needs a colpo but is saying she can't get off work until 08/06/24. Pt was advised JAG would like for her to have colpo sooner than Feb. 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 Will schedule colpo as soon as schedule is available. JSY

## 2024-05-10 ENCOUNTER — Other Ambulatory Visit: Admitting: Radiology

## 2024-08-08 ENCOUNTER — Other Ambulatory Visit

## 2024-08-10 ENCOUNTER — Other Ambulatory Visit
